# Patient Record
Sex: Female | Born: 1995 | Hispanic: No | Marital: Single | Smoking: Former smoker
Health system: Southern US, Community
[De-identification: ages and names within clinical notes are randomized; demographics above are authoritative.]

## PROBLEM LIST (undated history)

## (undated) ENCOUNTER — Inpatient Hospital Stay (HOSPITAL_COMMUNITY): Payer: Self-pay

## (undated) DIAGNOSIS — O139 Gestational [pregnancy-induced] hypertension without significant proteinuria, unspecified trimester: Secondary | ICD-10-CM

## (undated) HISTORY — PX: WISDOM TOOTH EXTRACTION: SHX21

---
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
## 2014-03-29 LAB — DRUG SCREEN, URINE: Drug Screen, Urine: POSITIVE

## 2014-04-12 LAB — OB RESULTS CONSOLE ABO/RH: RH TYPE: POSITIVE

## 2014-04-12 LAB — OB RESULTS CONSOLE PLATELET COUNT: Platelets: 258 10*3/uL

## 2014-04-12 LAB — CULTURE, OB URINE: Urine Culture, OB: NEGATIVE

## 2014-04-12 LAB — OB RESULTS CONSOLE HGB/HCT, BLOOD
HCT: 38 %
Hemoglobin: 13.1 g/dL

## 2014-04-12 LAB — OB RESULTS CONSOLE RPR: RPR: NONREACTIVE

## 2014-04-12 LAB — OB RESULTS CONSOLE HIV ANTIBODY (ROUTINE TESTING): HIV: NONREACTIVE

## 2014-04-12 LAB — OB RESULTS CONSOLE HEPATITIS B SURFACE ANTIGEN: Hepatitis B Surface Ag: NEGATIVE

## 2014-04-12 LAB — OB RESULTS CONSOLE RUBELLA ANTIBODY, IGM: Rubella: IMMUNE

## 2014-04-12 LAB — CYSTIC FIBROSIS DIAGNOSTIC STUDY: INTERPRETATION-CFDNA: NEGATIVE

## 2014-04-12 LAB — OB RESULTS CONSOLE ANTIBODY SCREEN: Antibody Screen: NEGATIVE

## 2014-07-16 ENCOUNTER — Encounter: Payer: Self-pay | Admitting: Advanced Practice Midwife

## 2014-07-16 ENCOUNTER — Ambulatory Visit (HOSPITAL_COMMUNITY)
Admission: RE | Admit: 2014-07-16 | Discharge: 2014-07-16 | Disposition: A | Discharge status: Home / Self Care | Payer: Medicaid Other | Source: Ambulatory Visit | Attending: Advanced Practice Midwife | Admitting: Advanced Practice Midwife

## 2014-07-16 ENCOUNTER — Ambulatory Visit (INDEPENDENT_AMBULATORY_CARE_PROVIDER_SITE_OTHER): Payer: Medicaid Other | Admitting: Advanced Practice Midwife

## 2014-07-16 VITALS — BP 106/62 | HR 77 | Temp 98.2°F | Wt 135.2 lb

## 2014-07-16 DIAGNOSIS — Z3A22 22 weeks gestation of pregnancy: Secondary | ICD-10-CM | POA: Diagnosis not present

## 2014-07-16 DIAGNOSIS — Z36 Encounter for antenatal screening of mother: Secondary | ICD-10-CM | POA: Insufficient documentation

## 2014-07-16 DIAGNOSIS — Z3402 Encounter for supervision of normal first pregnancy, second trimester: Secondary | ICD-10-CM

## 2014-07-16 DIAGNOSIS — Z361 Encounter for antenatal screening for raised alphafetoprotein level: Secondary | ICD-10-CM | POA: Insufficient documentation

## 2014-07-16 DIAGNOSIS — Z3403 Encounter for supervision of normal first pregnancy, third trimester: Secondary | ICD-10-CM | POA: Insufficient documentation

## 2014-07-16 DIAGNOSIS — Z23 Encounter for immunization: Secondary | ICD-10-CM

## 2014-07-16 LAB — POCT URINALYSIS DIP (DEVICE)
Bilirubin Urine: NEGATIVE
GLUCOSE, UA: NEGATIVE mg/dL
Hgb urine dipstick: NEGATIVE
KETONES UR: NEGATIVE mg/dL
Leukocytes, UA: NEGATIVE
NITRITE: NEGATIVE
Protein, ur: NEGATIVE mg/dL
Specific Gravity, Urine: 1.025 (ref 1.005–1.030)
Urobilinogen, UA: 1 mg/dL (ref 0.0–1.0)
pH: 6 (ref 5.0–8.0)

## 2014-07-16 NOTE — Progress Notes (Signed)
[place]  See Smart Set  Subjective:    Emily Frazier is a G1P0 [id number] being seen today for her first obstetrical visit.  [facility] Missed a few visits. Patient does not intend to breast feed. Pregnancy history fully reviewed.  Patient reports no complaints.  Filed Vitals:   07/16/14 0900  BP: 106/62  Pulse: 77  Temp: 98.2 F (36.8 C)  Weight: 135 lb 3.2 oz (61.326 kg)    HISTORY: OB History  Gravida Para Term Preterm AB SAB TAB Ectopic Multiple Living  1             # Outcome Date GA Lbr Len/2nd Weight Sex Delivery Anes PTL Lv  1 Current              History reviewed. No pertinent past medical history. History reviewed. No pertinent past surgical history. History reviewed. No pertinent family history.   Exam    Uterus:  Fundal Height: 22 cm  Pelvic Exam:    Perineum: Deferred to exam at other practice   Vulva: deferred   Vagina:  Deferred   pH:    Cervix: deferred   Adnexa: not evaluated   Bony Pelvis: gynecoid  System: Breast:  normal appearance, no masses or tenderness   Skin: normal coloration and turgor, no rashes    Neurologic: oriented, grossly non-focal   Extremities: normal strength, tone, and muscle mass   HEENT neck supple with midline trachea   Mouth/Teeth mucous membranes moist, pharynx normal without lesions   Neck supple and no masses   Cardiovascular: regular rate and rhythm, no murmurs or gallops   Respiratory:  appears well, vitals normal, no respiratory distress, acyanotic, normal RR, ear and throat exam is normal, neck free of mass or lymphadenopathy, chest clear, no wheezing, crepitations, rhonchi, normal symmetric air entry   Abdomen: soft, non-tender; bowel sounds normal; no masses,  no organomegaly   Urinary: deferred      Assessment:    Pregnancy: G1P0 Patient Active Problem List   Diagnosis Date Noted  . Supervision of normal first  pregnancy in second trimester 07/16/2014        Plan:    Routines reviewed regarding practice personnel, students and routines of care  Initial labs drawn. Prenatal vitamins. Problem list reviewed and updated. Genetic Screening discussed Quad Screen: ordered.  Ultrasound discussed; fetal survey: ordered.  Follow up in 4 weeks. 50% of 30 min visit spent on counseling and coordination of care.     [facility]Marisela Line 07/16/2014

## 2014-07-16 NOTE — Progress Notes (Signed)
Weight gain 25-35lbs Flu vaccine consented and info given  Given both new ob and 28 week packet

## 2014-07-16 NOTE — Patient Instructions (Signed)
Second Trimester of Pregnancy The second trimester is from week 13 through week 28, months 4 through 6. The second trimester is often a time when you feel your best. Your body has also adjusted to being pregnant, and you begin to feel better physically. Usually, morning sickness has lessened or quit completely, you may have more energy, and you may have an increase in appetite. The second trimester is also a time when the fetus is growing rapidly. At the end of the sixth month, the fetus is about 9 inches long and weighs about 1 pounds. You will likely begin to feel the baby move (quickening) between 18 and 20 weeks of the pregnancy. BODY CHANGES Your body goes through many changes during pregnancy. The changes vary from woman to woman.   Your weight will continue to increase. You will notice your lower abdomen bulging out.  You may begin to get stretch marks on your hips, abdomen, and breasts.  You may develop headaches that can be relieved by medicines approved by your health care provider.  You may urinate more often because the fetus is pressing on your bladder.  You may develop or continue to have heartburn as a result of your pregnancy.  You may develop constipation because certain hormones are causing the muscles that push waste through your intestines to slow down.  You may develop hemorrhoids or swollen, bulging veins (varicose veins).  You may have back pain because of the weight gain and pregnancy hormones relaxing your joints between the bones in your pelvis and as a result of a shift in weight and the muscles that support your balance.  Your breasts will continue to grow and be tender.  Your gums may bleed and may be sensitive to brushing and flossing.  Dark spots or blotches (chloasma, mask of pregnancy) may develop on your face. This will likely fade after the baby is born.  A dark line from your belly button to the pubic area (linea nigra) may appear. This will likely fade  after the baby is born.  You may have changes in your hair. These can include thickening of your hair, rapid growth, and changes in texture. Some women also have hair loss during or after pregnancy, or hair that feels dry or thin. Your hair will most likely return to normal after your baby is born. WHAT TO EXPECT AT YOUR PRENATAL VISITS During a routine prenatal visit:  You will be weighed to make sure you and the fetus are growing normally.  Your blood pressure will be taken.  Your abdomen will be measured to track your baby's growth.  The fetal heartbeat will be listened to.  Any test results from the previous visit will be discussed. Your health care provider may ask you:  How you are feeling.  If you are feeling the baby move.  If you have had any abnormal symptoms, such as leaking fluid, bleeding, severe headaches, or abdominal cramping.  If you have any questions. Other tests that may be performed during your second trimester include:  Blood tests that check for:  Low iron levels (anemia).  Gestational diabetes (between 24 and 28 weeks).  Rh antibodies.  Urine tests to check for infections, diabetes, or protein in the urine.  An ultrasound to confirm the proper growth and development of the baby.  An amniocentesis to check for possible genetic problems.  Fetal screens for spina bifida and Down syndrome. HOME CARE INSTRUCTIONS   Avoid all smoking, herbs, alcohol, and unprescribed   drugs. These chemicals affect the formation and growth of the baby.  Follow your health care provider's instructions regarding medicine use. There are medicines that are either safe or unsafe to take during pregnancy.  Exercise only as directed by your health care provider. Experiencing uterine cramps is a good sign to stop exercising.  Continue to eat regular, healthy meals.  Wear a good support bra for breast tenderness.  Do not use hot tubs, steam rooms, or saunas.  Wear your  seat belt at all times when driving.  Avoid raw meat, uncooked cheese, cat litter boxes, and soil used by cats. These carry germs that can cause birth defects in the baby.  Take your prenatal vitamins.  Try taking a stool softener (if your health care provider approves) if you develop constipation. Eat more high-fiber foods, such as fresh vegetables or fruit and whole grains. Drink plenty of fluids to keep your urine clear or pale yellow.  Take warm sitz baths to soothe any pain or discomfort caused by hemorrhoids. Use hemorrhoid cream if your health care provider approves.  If you develop varicose veins, wear support hose. Elevate your feet for 15 minutes, 3-4 times a day. Limit salt in your diet.  Avoid heavy lifting, wear low heel shoes, and practice good posture.  Rest with your legs elevated if you have leg cramps or low back pain.  Visit your dentist if you have not gone yet during your pregnancy. Use a soft toothbrush to brush your teeth and be gentle when you floss.  A sexual relationship may be continued unless your health care provider directs you otherwise.  Continue to go to all your prenatal visits as directed by your health care provider. SEEK MEDICAL CARE IF:   You have dizziness.  You have mild pelvic cramps, pelvic pressure, or nagging pain in the abdominal area.  You have persistent nausea, vomiting, or diarrhea.  You have a bad smelling vaginal discharge.  You have pain with urination. SEEK IMMEDIATE MEDICAL CARE IF:   You have a fever.  You are leaking fluid from your vagina.  You have spotting or bleeding from your vagina.  You have severe abdominal cramping or pain.  You have rapid weight gain or loss.  You have shortness of breath with chest pain.  You notice sudden or extreme swelling of your face, hands, ankles, feet, or legs.  You have not felt your baby move in over an hour.  You have severe headaches that do not go away with  medicine.  You have vision changes. Document Released: 08/07/2001 Document Revised: 08/18/2013 Document Reviewed: 10/14/2012 [employer] Patient Information 2015 [employer]. This information is not intended to replace advice given to you by your health care provider. Make sure you discuss any questions you have with your health care provider.  

## 2014-07-19 LAB — AFP, QUAD SCREEN
AFP: 84.9 ng/mL
Curr Gest Age: 22.3 wks.days
Down Syndrome Scr Risk Est: 1:20200 {titer}
HCG TOTAL: 14.42 [IU]/mL
INH: 237.8 pg/mL
Interpretation-AFP: NEGATIVE
MOM FOR HCG: 0.71
MOM FOR INH: 0.97
MoM for AFP: 0.99
OPEN SPINA BIFIDA: NEGATIVE
TRI 18 SCR RISK EST: NEGATIVE
Trisomy 18 (Edward) Syndrome Interp.: 1:32300 {titer}
uE3 Mom: 0.86
uE3 Value: 2.31 ng/mL

## 2014-08-13 ENCOUNTER — Ambulatory Visit (INDEPENDENT_AMBULATORY_CARE_PROVIDER_SITE_OTHER): Payer: Medicaid Other | Admitting: Advanced Practice Midwife

## 2014-08-13 VITALS — BP 101/60 | HR 92 | Temp 98.2°F | Wt 142.6 lb

## 2014-08-13 DIAGNOSIS — Z23 Encounter for immunization: Secondary | ICD-10-CM

## 2014-08-13 DIAGNOSIS — IMO0002 Reserved for concepts with insufficient information to code with codable children: Secondary | ICD-10-CM

## 2014-08-13 DIAGNOSIS — Z3402 Encounter for supervision of normal first pregnancy, second trimester: Secondary | ICD-10-CM

## 2014-08-13 DIAGNOSIS — Z36 Encounter for antenatal screening of mother: Secondary | ICD-10-CM

## 2014-08-13 DIAGNOSIS — Z0489 Encounter for examination and observation for other specified reasons: Secondary | ICD-10-CM

## 2014-08-13 DIAGNOSIS — Z3492 Encounter for supervision of normal pregnancy, unspecified, second trimester: Secondary | ICD-10-CM

## 2014-08-13 DIAGNOSIS — Z3482 Encounter for supervision of other normal pregnancy, second trimester: Secondary | ICD-10-CM

## 2014-08-13 DIAGNOSIS — R000 Tachycardia, unspecified: Secondary | ICD-10-CM

## 2014-08-13 LAB — CBC
HCT: 29.4 % — ABNORMAL LOW (ref 36.0–46.0)
Hemoglobin: 10 g/dL — ABNORMAL LOW (ref 12.0–15.0)
MCH: 31.7 pg (ref 26.0–34.0)
MCHC: 34 g/dL (ref 30.0–36.0)
MCV: 93.3 fL (ref 78.0–100.0)
MPV: 9.7 fL (ref 9.4–12.4)
PLATELETS: 223 10*3/uL (ref 150–400)
RBC: 3.15 MIL/uL — AB (ref 3.87–5.11)
RDW: 11.9 % (ref 11.5–15.5)
WBC: 7.3 10*3/uL (ref 4.0–10.5)

## 2014-08-13 LAB — POCT URINALYSIS DIP (DEVICE)
Bilirubin Urine: NEGATIVE
Glucose, UA: NEGATIVE mg/dL
Hgb urine dipstick: NEGATIVE
Ketones, ur: NEGATIVE mg/dL
Leukocytes, UA: NEGATIVE
NITRITE: NEGATIVE
PH: 6.5 (ref 5.0–8.0)
PROTEIN: NEGATIVE mg/dL
SPECIFIC GRAVITY, URINE: 1.025 (ref 1.005–1.030)
Urobilinogen, UA: 1 mg/dL (ref 0.0–1.0)

## 2014-08-13 LAB — HIV ANTIBODY (ROUTINE TESTING W REFLEX): HIV 1&2 Ab, 4th Generation: NONREACTIVE

## 2014-08-13 LAB — GLUCOSE TOLERANCE, 1 HOUR (50G) W/O FASTING: Glucose, 1 Hour GTT: 107 mg/dL (ref 70–140)

## 2014-08-13 LAB — RPR

## 2014-08-13 MED ORDER — TETANUS-DIPHTH-ACELL PERTUSSIS 5-2.5-18.5 LF-MCG/0.5 IM SUSP
0.5000 mL | Freq: Once | INTRAMUSCULAR | Status: AC
  Administered 2014-08-13: 0.5 mL via INTRAMUSCULAR

## 2014-08-13 NOTE — Progress Notes (Signed)
Is not taking prenatal vitamins, offered prenatal vitamin prescription which she declines.We discussed importance of taking prenatal vitamins. She plans to buy over the counter and restart. States has noticed last few nights when she lies down feels like heart is racing and"lose my breath".

## 2014-08-13 NOTE — Patient Instructions (Signed)
Preterm Labor Information Preterm labor is when labor starts at less than 37 weeks of pregnancy. The normal length of a pregnancy is 39 to 41 weeks. CAUSES Often, there is no identifiable underlying cause as to why a woman goes into preterm labor. One of the most common known causes of preterm labor is infection. Infections of the uterus, cervix, vagina, amniotic sac, bladder, kidney, or even the lungs (pneumonia) can cause labor to start. Other suspected causes of preterm labor include:   Urogenital infections, such as yeast infections and bacterial vaginosis.   Uterine abnormalities (uterine shape, uterine septum, fibroids, or bleeding from the placenta).   A cervix that has been operated on (it may fail to stay closed).   Malformations in the fetus.   Multiple gestations (twins, triplets, and so on).   Breakage of the amniotic sac.  RISK FACTORS  Having a previous history of preterm labor.   Having premature rupture of membranes (PROM).   Having a placenta that covers the opening of the cervix (placenta previa).   Having a placenta that separates from the uterus (placental abruption).   Having a cervix that is too weak to hold the fetus in the uterus (incompetent cervix).   Having too much fluid in the amniotic sac (polyhydramnios).   Taking illegal drugs or smoking while pregnant.   Not gaining enough weight while pregnant.   Being younger than 18 and older than 20 years old.   Having a low socioeconomic status.   Being African American. SYMPTOMS Signs and symptoms of preterm labor include:   Menstrual-like cramps, abdominal pain, or back pain.  Uterine contractions that are regular, as frequent as six in an hour, regardless of their intensity (may be mild or painful).  Contractions that start on the top of the uterus and spread down to the lower abdomen and back.   A sense of increased pelvic pressure.   A watery or bloody mucus discharge that  comes from the vagina.  TREATMENT Depending on the length of the pregnancy and other circumstances, your health care provider may suggest bed rest. If necessary, there are medicines that can be given to stop contractions and to mature the fetal lungs. If labor happens before 34 weeks of pregnancy, a prolonged hospital stay may be recommended. Treatment depends on the condition of both you and the fetus.  WHAT SHOULD YOU DO IF YOU THINK YOU ARE IN PRETERM LABOR? Call your health care provider right away. You will need to go to the hospital to get checked immediately. HOW CAN YOU PREVENT PRETERM LABOR IN FUTURE PREGNANCIES? You should:   Stop smoking if you smoke.  Maintain healthy weight gain and avoid chemicals and drugs that are not necessary.  Be watchful for any type of infection.  Inform your health care provider if you have a known history of preterm labor. Document Released: 11/03/2003 Document Revised: 04/15/2013 Document Reviewed: 09/15/2012 [employer] Patient Information 2015 [employer]. This information is not intended to replace advice given to you by your health care provider. Make sure you discuss any questions you have with your health care provider.  Breastfeeding Deciding to breastfeed is one of the best choices you can make for you and your baby. A change in hormones during pregnancy causes your breast tissue to grow and increases the number and size of your milk ducts. These hormones also allow proteins, sugars, and fats from your blood supply to make breast milk in your milk-producing glands. Hormones prevent breast milk   from being released before your baby is born as well as prompt milk flow after birth. Once breastfeeding has begun, thoughts of your baby, as well as his or her sucking or crying, can stimulate the release of milk from your milk-producing glands.  BENEFITS OF BREASTFEEDING For Your Baby  Your first milk (colostrum) helps your baby's digestive system  function better.   There are antibodies in your milk that help your baby fight off infections.   Your baby has a lower incidence of asthma, allergies, and sudden infant death syndrome.   The nutrients in breast milk are better for your baby than infant formulas and are designed uniquely for your baby's needs.   Breast milk improves your baby's brain development.   Your baby is less likely to develop other conditions, such as childhood obesity, asthma, or type 2 diabetes mellitus.  For You   Breastfeeding helps to create a very special bond between you and your baby.   Breastfeeding is convenient. Breast milk is always available at the correct temperature and costs nothing.   Breastfeeding helps to burn calories and helps you lose the weight gained during pregnancy.   Breastfeeding makes your uterus contract to its prepregnancy size faster and slows bleeding (lochia) after you give birth.   Breastfeeding helps to lower your risk of developing type 2 diabetes mellitus, osteoporosis, and breast or ovarian cancer later in life. SIGNS THAT YOUR BABY IS HUNGRY Early Signs of Hunger  Increased alertness or activity.  Stretching.  Movement of the head from side to side.  Movement of the head and opening of the mouth when the corner of the mouth or cheek is stroked (rooting).  Increased sucking sounds, smacking lips, cooing, sighing, or squeaking.  Hand-to-mouth movements.  Increased sucking of fingers or hands. Late Signs of Hunger  Fussing.  Intermittent crying. Extreme Signs of Hunger Signs of extreme hunger will require calming and consoling before your baby will be able to breastfeed successfully. Do not wait for the following signs of extreme hunger to occur before you initiate breastfeeding:   Restlessness.  A loud, strong cry.   Screaming. BREASTFEEDING BASICS Breastfeeding Initiation  Find a comfortable place to sit or lie down, with your neck and  back well supported.  Place a pillow or rolled up blanket under your baby to bring him or her to the level of your breast (if you are seated). Nursing pillows are specially designed to help support your arms and your baby while you breastfeed.  Make sure that your baby's abdomen is facing your abdomen.   Gently massage your breast. With your fingertips, massage from your chest wall toward your nipple in a circular motion. This encourages milk flow. You may need to continue this action during the feeding if your milk flows slowly.  Support your breast with 4 fingers underneath and your thumb above your nipple. Make sure your fingers are well away from your nipple and your baby's mouth.   Stroke your baby's lips gently with your finger or nipple.   When your baby's mouth is open wide enough, quickly bring your baby to your breast, placing your entire nipple and as much of the colored area around your nipple (areola) as possible into your baby's mouth.   More areola should be visible above your baby's upper lip than below the lower lip.   Your baby's tongue should be between his or her lower gum and your breast.   Ensure that your baby's mouth is   correctly positioned around your nipple (latched). Your baby's lips should create a seal on your breast and be turned out (everted).  It is common for your baby to suck about 2-3 minutes in order to start the flow of breast milk. Latching Teaching your baby how to latch on to your breast properly is very important. An improper latch can cause nipple pain and decreased milk supply for you and poor weight gain in your baby. Also, if your baby is not latched onto your nipple properly, he or she may swallow some air during feeding. This can make your baby fussy. Burping your baby when you switch breasts during the feeding can help to get rid of the air. However, teaching your baby to latch on properly is still the best way to prevent fussiness from  swallowing air while breastfeeding. Signs that your baby has successfully latched on to your nipple:    Silent tugging or silent sucking, without causing you pain.   Swallowing heard between every 3-4 sucks.    Muscle movement above and in front of his or her ears while sucking.  Signs that your baby has not successfully latched on to nipple:   Sucking sounds or smacking sounds from your baby while breastfeeding.  Nipple pain. If you think your baby has not latched on correctly, slip your finger into the corner of your baby's mouth to break the suction and place it between your baby's gums. Attempt breastfeeding initiation again. Signs of Successful Breastfeeding Signs from your baby:   A gradual decrease in the number of sucks or complete cessation of sucking.   Falling asleep.   Relaxation of his or her body.   Retention of a small amount of milk in his or her mouth.   Letting go of your breast by himself or herself. Signs from you:  Breasts that have increased in firmness, weight, and size 1-3 hours after feeding.   Breasts that are softer immediately after breastfeeding.  Increased milk volume, as well as a change in milk consistency and color by the fifth day of breastfeeding.   Nipples that are not sore, cracked, or bleeding. Signs That Your Baby is Getting Enough Milk  Wetting at least 3 diapers in a 24-hour period. The urine should be clear and pale yellow by age 5 days.  At least 3 stools in a 24-hour period by age 5 days. The stool should be soft and yellow.  At least 3 stools in a 24-hour period by age 7 days. The stool should be seedy and yellow.  No loss of weight greater than 10% of birth weight during the first 3 days of age.  Average weight gain of 4-7 ounces (113-198 g) per week after age 4 days.  Consistent daily weight gain by age 5 days, without weight loss after the age of 2 weeks. After a feeding, your baby may spit up a small amount.  This is common. BREASTFEEDING FREQUENCY AND DURATION Frequent feeding will help you make more milk and can prevent sore nipples and breast engorgement. Breastfeed when you feel the need to reduce the fullness of your breasts or when your baby shows signs of hunger. This is called "breastfeeding on demand." Avoid introducing a pacifier to your baby while you are working to establish breastfeeding (the first 4-6 weeks after your baby is born). After this time you may choose to use a pacifier. Research has shown that pacifier use during the first year of a baby's life decreases the   risk of sudden infant death syndrome (SIDS). Allow your baby to feed on each breast as long as he or she wants. Breastfeed until your baby is finished feeding. When your baby unlatches or falls asleep while feeding from the first breast, offer the second breast. Because newborns are often sleepy in the first few weeks of life, you may need to awaken your baby to get him or her to feed. Breastfeeding times will vary from baby to baby. However, the following rules can serve as a guide to help you ensure that your baby is properly fed:  Newborns (babies 4 weeks of age or younger) may breastfeed every 1-3 hours.  Newborns should not go longer than 3 hours during the day or 5 hours during the night without breastfeeding.  You should breastfeed your baby a minimum of 8 times in a 24-hour period until you begin to introduce solid foods to your baby at around 6 months of age. BREAST MILK PUMPING Pumping and storing breast milk allows you to ensure that your baby is exclusively fed your breast milk, even at times when you are unable to breastfeed. This is especially important if you are going back to work while you are still breastfeeding or when you are not able to be present during feedings. Your lactation consultant can give you guidelines on how long it is safe to store breast milk.  A breast pump is a machine that allows you to pump  milk from your breast into a sterile bottle. The pumped breast milk can then be stored in a refrigerator or freezer. Some breast pumps are operated by hand, while others use electricity. Ask your lactation consultant which type will work best for you. Breast pumps can be purchased, but some hospitals and breastfeeding support groups lease breast pumps on a monthly basis. A lactation consultant can teach you how to hand express breast milk, if you prefer not to use a pump.  CARING FOR YOUR BREASTS WHILE YOU BREASTFEED Nipples can become dry, cracked, and sore while breastfeeding. The following recommendations can help keep your breasts moisturized and healthy:  Avoid using soap on your nipples.   Wear a supportive bra. Although not required, special nursing bras and tank tops are designed to allow access to your breasts for breastfeeding without taking off your entire bra or top. Avoid wearing underwire-style bras or extremely tight bras.  Air dry your nipples for 3-4minutes after each feeding.   Use only cotton bra pads to absorb leaked breast milk. Leaking of breast milk between feedings is normal.   Use lanolin on your nipples after breastfeeding. Lanolin helps to maintain your skin's normal moisture barrier. If you use pure lanolin, you do not need to wash it off before feeding your baby again. Pure lanolin is not toxic to your baby. You may also hand express a few drops of breast milk and gently massage that milk into your nipples and allow the milk to air dry. In the first few weeks after giving birth, some women experience extremely full breasts (engorgement). Engorgement can make your breasts feel heavy, warm, and tender to the touch. Engorgement peaks within 3-5 days after you give birth. The following recommendations can help ease engorgement:  Completely empty your breasts while breastfeeding or pumping. You may want to start by applying warm, moist heat (in the shower or with warm  water-soaked hand towels) just before feeding or pumping. This increases circulation and helps the milk flow. If your baby does not   completely empty your breasts while breastfeeding, pump any extra milk after he or she is finished.  Wear a snug bra (nursing or regular) or tank top for 1-2 days to signal your body to slightly decrease milk production.  Apply ice packs to your breasts, unless this is too uncomfortable for you.  Make sure that your baby is latched on and positioned properly while breastfeeding. If engorgement persists after 48 hours of following these recommendations, contact your health care provider or a lactation consultant. OVERALL HEALTH CARE RECOMMENDATIONS WHILE BREASTFEEDING  Eat healthy foods. Alternate between meals and snacks, eating 3 of each per day. Because what you eat affects your breast milk, some of the foods may make your baby more irritable than usual. Avoid eating these foods if you are sure that they are negatively affecting your baby.  Drink milk, fruit juice, and water to satisfy your thirst (about 10 glasses a day).   Rest often, relax, and continue to take your prenatal vitamins to prevent fatigue, stress, and anemia.  Continue breast self-awareness checks.  Avoid chewing and smoking tobacco.  Avoid alcohol and drug use. Some medicines that may be harmful to your baby can pass through breast milk. It is important to ask your health care provider before taking any medicine, including all over-the-counter and prescription medicine as well as vitamin and herbal supplements. It is possible to become pregnant while breastfeeding. If birth control is desired, ask your health care provider about options that will be safe for your baby. SEEK MEDICAL CARE IF:   You feel like you want to stop breastfeeding or have become frustrated with breastfeeding.  You have painful breasts or nipples.  Your nipples are cracked or bleeding.  Your breasts are red,  tender, or warm.  You have a swollen area on either breast.  You have a fever or chills.  You have nausea or vomiting.  You have drainage other than breast milk from your nipples.  Your breasts do not become full before feedings by the fifth day after you give birth.  You feel sad and depressed.  Your baby is too sleepy to eat well.  Your baby is having trouble sleeping.   Your baby is wetting less than 3 diapers in a 24-hour period.  Your baby has less than 3 stools in a 24-hour period.  Your baby's skin or the white part of his or her eyes becomes yellow.   Your baby is not gaining weight by 5 days of age. SEEK IMMEDIATE MEDICAL CARE IF:   Your baby is overly tired (lethargic) and does not want to wake up and feed.  Your baby develops an unexplained fever. Document Released: 08/13/2005 Document Revised: 08/18/2013 Document Reviewed: 02/04/2013 [employer] Patient Information 2015 [employer]. This information is not intended to replace advice given to you by your health care provider. Make sure you discuss any questions you have with your health care provider.  

## 2014-08-13 NOTE — Progress Notes (Signed)
28 week labs. Took BF class. Having few episodes of SOB, rapid HR when lying down to go to sleep past two nights. Lying on back. Encouraged to avoid lying on back. See if Sx resolve w/ rolling to side or sitting up. Keep log. May need Holter monitoring if Sx persist despite position change.

## 2014-08-17 ENCOUNTER — Ambulatory Visit (HOSPITAL_COMMUNITY)
Admission: RE | Admit: 2014-08-17 | Discharge: 2014-08-17 | Disposition: A | Discharge status: Home / Self Care | Payer: Medicaid Other | Source: Ambulatory Visit | Attending: Advanced Practice Midwife | Admitting: Advanced Practice Midwife

## 2014-08-17 DIAGNOSIS — Z36 Encounter for antenatal screening of mother: Secondary | ICD-10-CM | POA: Diagnosis not present

## 2014-08-17 DIAGNOSIS — Z0489 Encounter for examination and observation for other specified reasons: Secondary | ICD-10-CM | POA: Insufficient documentation

## 2014-08-17 DIAGNOSIS — Z3A27 27 weeks gestation of pregnancy: Secondary | ICD-10-CM | POA: Insufficient documentation

## 2014-08-17 DIAGNOSIS — IMO0002 Reserved for concepts with insufficient information to code with codable children: Secondary | ICD-10-CM | POA: Insufficient documentation

## 2014-08-27 NOTE — L&D Delivery Note (Cosign Needed)
Patient is 19 y.o. G1P0000 [id number] admitted for IOL for pre-eclampsia.   Delivery Note At 6:07 PM a viable female was delivered via Vaginal, Spontaneous Delivery (Presentation: LOA).  APGAR: 7, 8; weight 4 lb 14 oz (2211 g).   Placenta status: Intact, Spontaneous.  Cord: 3 vessels with the following complications: None.  Anesthesia: Epidural  Episiotomy: None Lacerations: Labial, bilateral  Suture Repair: Monocryl 3.0 Est. Blood Loss (mL):  150mL  Mom to postpartum.  Baby to Couplet care / Skin to Skin. She is not on magnesium sulfate.   Henson,Amber 10/29/2014, 6:47 PM  I was gloved and present for delivery in its entirety.  Second stage of labor progressed, baby delivered after few contractions.  no decels during second stage noted.  Complications: none  Lacerations: bilateral labial  EBL: 150mL  IOL 2/2 preeclampsia, no severe fx, did not receive mag  Perry MountACOSTA,Felis Quillin ROCIO, MD 2:44 AM

## 2014-08-31 ENCOUNTER — Ambulatory Visit (INDEPENDENT_AMBULATORY_CARE_PROVIDER_SITE_OTHER): Payer: Medicaid Other | Admitting: Advanced Practice Midwife

## 2014-08-31 VITALS — BP 107/43 | HR 75 | Temp 98.3°F | Wt 143.3 lb

## 2014-08-31 DIAGNOSIS — Z3493 Encounter for supervision of normal pregnancy, unspecified, third trimester: Secondary | ICD-10-CM

## 2014-08-31 LAB — POCT URINALYSIS DIP (DEVICE)
Bilirubin Urine: NEGATIVE
Glucose, UA: NEGATIVE mg/dL
HGB URINE DIPSTICK: NEGATIVE
Leukocytes, UA: NEGATIVE
Nitrite: NEGATIVE
PH: 6 (ref 5.0–8.0)
PROTEIN: 30 mg/dL — AB
UROBILINOGEN UA: 1 mg/dL (ref 0.0–1.0)

## 2014-08-31 NOTE — Progress Notes (Signed)
Informed glucola 107, normal. Good FM. Bruised ankle. Advised ice.  Reviewed what to come in for.

## 2014-08-31 NOTE — Patient Instructions (Signed)
Third Trimester of Pregnancy The third trimester is from week 29 through week 42, months 7 through 9. The third trimester is a time when the fetus is growing rapidly. At the end of the ninth month, the fetus is about 20 inches in length and weighs 6-10 pounds.  BODY CHANGES Your body goes through many changes during pregnancy. The changes vary from woman to woman.   Your weight will continue to increase. You can expect to gain 25-35 pounds (11-16 kg) by the end of the pregnancy.  You may begin to get stretch marks on your hips, abdomen, and breasts.  You may urinate more often because the fetus is moving lower into your pelvis and pressing on your bladder.  You may develop or continue to have heartburn as a result of your pregnancy.  You may develop constipation because certain hormones are causing the muscles that push waste through your intestines to slow down.  You may develop hemorrhoids or swollen, bulging veins (varicose veins).  You may have pelvic pain because of the weight gain and pregnancy hormones relaxing your joints between the bones in your pelvis. Backaches may result from overexertion of the muscles supporting your posture.  You may have changes in your hair. These can include thickening of your hair, rapid growth, and changes in texture. Some women also have hair loss during or after pregnancy, or hair that feels dry or thin. Your hair will most likely return to normal after your baby is born.  Your breasts will continue to grow and be tender. A yellow discharge may leak from your breasts called colostrum.  Your belly button may stick out.  You may feel short of breath because of your expanding uterus.  You may notice the fetus "dropping," or moving lower in your abdomen.  You may have a bloody mucus discharge. This usually occurs a few days to a week before labor begins.  Your cervix becomes thin and soft (effaced) near your due date. WHAT TO EXPECT AT YOUR PRENATAL  EXAMS  You will have prenatal exams every 2 weeks until week 36. Then, you will have weekly prenatal exams. During a routine prenatal visit:  You will be weighed to make sure you and the fetus are growing normally.  Your blood pressure is taken.  Your abdomen will be measured to track your baby's growth.  The fetal heartbeat will be listened to.  Any test results from the previous visit will be discussed.  You may have a cervical check near your due date to see if you have effaced. At around 36 weeks, your caregiver will check your cervix. At the same time, your caregiver will also perform a test on the secretions of the vaginal tissue. This test is to determine if a type of bacteria, Group B streptococcus, is present. Your caregiver will explain this further. Your caregiver may ask you:  What your birth plan is.  How you are feeling.  If you are feeling the baby move.  If you have had any abnormal symptoms, such as leaking fluid, bleeding, severe headaches, or abdominal cramping.  If you have any questions. Other tests or screenings that may be performed during your third trimester include:  Blood tests that check for low iron levels (anemia).  Fetal testing to check the health, activity level, and growth of the fetus. Testing is done if you have certain medical conditions or if there are problems during the pregnancy. FALSE LABOR You may feel small, irregular contractions that   eventually go away. These are called Braxton Hicks contractions, or false labor. Contractions may last for hours, days, or even weeks before true labor sets in. If contractions come at regular intervals, intensify, or become painful, it is best to be seen by your caregiver.  SIGNS OF LABOR   Menstrual-like cramps.  Contractions that are 5 minutes apart or less.  Contractions that start on the top of the uterus and spread down to the lower abdomen and back.  A sense of increased pelvic pressure or back  pain.  A watery or bloody mucus discharge that comes from the vagina. If you have any of these signs before the 37th week of pregnancy, call your caregiver right away. You need to go to the hospital to get checked immediately. HOME CARE INSTRUCTIONS   Avoid all smoking, herbs, alcohol, and unprescribed drugs. These chemicals affect the formation and growth of the baby.  Follow your caregiver's instructions regarding medicine use. There are medicines that are either safe or unsafe to take during pregnancy.  Exercise only as directed by your caregiver. Experiencing uterine cramps is a good sign to stop exercising.  Continue to eat regular, healthy meals.  Wear a good support bra for breast tenderness.  Do not use hot tubs, steam rooms, or saunas.  Wear your seat belt at all times when driving.  Avoid raw meat, uncooked cheese, cat litter boxes, and soil used by cats. These carry germs that can cause birth defects in the baby.  Take your prenatal vitamins.  Try taking a stool softener (if your caregiver approves) if you develop constipation. Eat more high-fiber foods, such as fresh vegetables or fruit and whole grains. Drink plenty of fluids to keep your urine clear or pale yellow.  Take warm sitz baths to soothe any pain or discomfort caused by hemorrhoids. Use hemorrhoid cream if your caregiver approves.  If you develop varicose veins, wear support hose. Elevate your feet for 15 minutes, 3-4 times a day. Limit salt in your diet.  Avoid heavy lifting, wear low heal shoes, and practice good posture.  Rest a lot with your legs elevated if you have leg cramps or low back pain.  Visit your dentist if you have not gone during your pregnancy. Use a soft toothbrush to brush your teeth and be gentle when you floss.  A sexual relationship may be continued unless your caregiver directs you otherwise.  Do not travel far distances unless it is absolutely necessary and only with the approval  of your caregiver.  Take prenatal classes to understand, practice, and ask questions about the labor and delivery.  Make a trial run to the hospital.  Pack your hospital bag.  Prepare the baby's nursery.  Continue to go to all your prenatal visits as directed by your caregiver. SEEK MEDICAL CARE IF:  You are unsure if you are in labor or if your water has broken.  You have dizziness.  You have mild pelvic cramps, pelvic pressure, or nagging pain in your abdominal area.  You have persistent nausea, vomiting, or diarrhea.  You have a bad smelling vaginal discharge.  You have pain with urination. SEEK IMMEDIATE MEDICAL CARE IF:   You have a fever.  You are leaking fluid from your vagina.  You have spotting or bleeding from your vagina.  You have severe abdominal cramping or pain.  You have rapid weight loss or gain.  You have shortness of breath with chest pain.  You notice sudden or extreme swelling   of your face, hands, ankles, feet, or legs.  You have not felt your baby move in over an hour.  You have severe headaches that do not go away with medicine.  You have vision changes. Document Released: 08/07/2001 Document Revised: 08/18/2013 Document Reviewed: 10/14/2012 ExitCare Patient Information 2015 [employer]. This information is not intended to replace advice given to you by your health care provider. Make sure you discuss any questions you have with your health care provider.  

## 2014-09-14 ENCOUNTER — Ambulatory Visit (INDEPENDENT_AMBULATORY_CARE_PROVIDER_SITE_OTHER): Payer: Medicaid Other | Admitting: Physician Assistant

## 2014-09-14 VITALS — BP 118/62 | HR 86 | Temp 98.4°F | Wt 146.4 lb

## 2014-09-14 DIAGNOSIS — Z3493 Encounter for supervision of normal pregnancy, unspecified, third trimester: Secondary | ICD-10-CM

## 2014-09-14 DIAGNOSIS — Z3483 Encounter for supervision of other normal pregnancy, third trimester: Secondary | ICD-10-CM

## 2014-09-14 LAB — POCT URINALYSIS DIP (DEVICE)
BILIRUBIN URINE: NEGATIVE
GLUCOSE, UA: NEGATIVE mg/dL
KETONES UR: NEGATIVE mg/dL
Leukocytes, UA: NEGATIVE
Nitrite: NEGATIVE
Protein, ur: NEGATIVE mg/dL
Specific Gravity, Urine: 1.025 (ref 1.005–1.030)
UROBILINOGEN UA: 0.2 mg/dL (ref 0.0–1.0)
pH: 6.5 (ref 5.0–8.0)

## 2014-09-14 NOTE — Patient Instructions (Addendum)

## 2014-09-14 NOTE — Progress Notes (Signed)
31 weeks, stable without complaint.  Endorses good fetal movement.  Denies LOF, Vaginal bleeding, dysuria.  Eating well.  Not using PNV.  Advised to take PNV daily - [facility] MVT okay RTC 2 weeks

## 2014-09-29 ENCOUNTER — Ambulatory Visit (INDEPENDENT_AMBULATORY_CARE_PROVIDER_SITE_OTHER): Payer: Medicaid Other | Admitting: Advanced Practice Midwife

## 2014-09-29 VITALS — BP 111/73 | HR 77 | Wt 150.0 lb

## 2014-09-29 DIAGNOSIS — Z3402 Encounter for supervision of normal first pregnancy, second trimester: Secondary | ICD-10-CM

## 2014-09-29 LAB — POCT URINALYSIS DIP (DEVICE)
BILIRUBIN URINE: NEGATIVE
GLUCOSE, UA: NEGATIVE mg/dL
HGB URINE DIPSTICK: NEGATIVE
Ketones, ur: NEGATIVE mg/dL
Leukocytes, UA: NEGATIVE
Nitrite: NEGATIVE
PH: 5.5 (ref 5.0–8.0)
PROTEIN: 30 mg/dL — AB
Specific Gravity, Urine: >=1.03 (ref 1.005–1.030)
Urobilinogen, UA: 0.2 mg/dL (ref 0.0–1.0)

## 2014-09-29 NOTE — Progress Notes (Signed)
33wk today. No complaints or concerns. Good fetal movements. Denies LOF, Vaginal bleeding, dysuria.  Pediatrician: [facility] Feeding Preference: FormulaNo bleeding or vaginal discharge.  RTC 2 weeks

## 2014-09-29 NOTE — Patient Instructions (Signed)

## 2014-09-29 NOTE — Progress Notes (Signed)
I was present for the exam and agree with above.  [place] Al[place] Kiernan, CNM 09/29/2014 11:50 AM

## 2014-10-13 ENCOUNTER — Ambulatory Visit (INDEPENDENT_AMBULATORY_CARE_PROVIDER_SITE_OTHER): Payer: Medicaid Other | Admitting: Advanced Practice Midwife

## 2014-10-13 VITALS — BP 121/80 | HR 67 | Temp 97.6°F | Wt 150.9 lb

## 2014-10-13 DIAGNOSIS — Z3402 Encounter for supervision of normal first pregnancy, second trimester: Secondary | ICD-10-CM

## 2014-10-13 LAB — POCT URINALYSIS DIP (DEVICE)
Bilirubin Urine: NEGATIVE
Glucose, UA: NEGATIVE mg/dL
HGB URINE DIPSTICK: NEGATIVE
Ketones, ur: NEGATIVE mg/dL
Leukocytes, UA: NEGATIVE
NITRITE: NEGATIVE
PH: 6 (ref 5.0–8.0)
PROTEIN: 100 mg/dL — AB
Specific Gravity, Urine: >=1.03 (ref 1.005–1.030)
UROBILINOGEN UA: 1 mg/dL (ref 0.0–1.0)

## 2014-10-13 NOTE — Patient Instructions (Signed)
Braxton Hicks Contractions °Contractions of the uterus can occur throughout pregnancy. Contractions are not always a sign that you are in labor.  °WHAT ARE BRAXTON HICKS CONTRACTIONS?  °Contractions that occur before labor are called Braxton Hicks contractions, or false labor. Toward the end of pregnancy (32-34 weeks), these contractions can develop more often and may become more forceful. This is not true labor because these contractions do not result in opening (dilatation) and thinning of the cervix. They are sometimes difficult to tell apart from true labor because these contractions can be forceful and people have different pain tolerances. You should not feel embarrassed if you go to the hospital with false labor. Sometimes, the only way to tell if you are in true labor is for your health care provider to look for changes in the cervix. °If there are no prenatal problems or other health problems associated with the pregnancy, it is completely safe to be sent home with false labor and await the onset of true labor. °HOW CAN YOU TELL THE DIFFERENCE BETWEEN TRUE AND FALSE LABOR? °False Labor °· The contractions of false labor are usually shorter and not as hard as those of true labor.   °· The contractions are usually irregular.   °· The contractions are often felt in the front of the lower abdomen and in the groin.   °· The contractions may go away when you walk around or change positions while lying down.   °· The contractions get weaker and are shorter lasting as time goes on.   °· The contractions do not usually become progressively stronger, regular, and closer together as with true labor.   °True Labor °1. Contractions in true labor last 30-70 seconds, become very regular, usually become more intense, and increase in frequency.   °2. The contractions do not go away with walking.   °3. The discomfort is usually felt in the top of the uterus and spreads to the lower abdomen and low back.   °4. True labor can  be determined by your health care provider with an exam. This will show that the cervix is dilating and getting thinner.   °WHAT TO REMEMBER °· Keep up with your usual exercises and follow other instructions given by your health care provider.   °· Take medicines as directed by your health care provider.   °· Keep your regular prenatal appointments.   °· Eat and drink lightly if you think you are going into labor.   °· If Braxton Hicks contractions are making you uncomfortable:   °· Change your position from lying down or resting to walking, or from walking to resting.   °· Sit and rest in a tub of warm water.   °· Drink 2-3 glasses of water. Dehydration may cause these contractions.   °· Do slow and deep breathing several times an hour.   °WHEN SHOULD I SEEK IMMEDIATE MEDICAL CARE? °Seek immediate medical care if: °· Your contractions become stronger, more regular, and closer together.   °· You have fluid leaking or gushing from your vagina.   °· You have a fever.   °· You pass blood-tinged mucus.   °· You have vaginal bleeding.   °· You have continuous abdominal pain.   °· You have low back pain that you never had before.   °· You feel your baby's head pushing down and causing pelvic pressure.   °· Your baby is not moving as much as it used to.   °Document Released: 08/13/2005 Document Revised: 08/18/2013 Document Reviewed: 05/25/2013 °Exit[employer]® Patient Information ©2015 [employer]. This information is not intended to replace advice given to you by your health care   provider. Make sure you discuss any questions you have with your health care provider. ° °Fetal Movement Counts °Patient Name: __________________________________________________ Patient Due Date: ____________________ °Performing a fetal movement count is highly recommended in high-risk pregnancies, but it is good for every pregnant woman to do. Your health care provider may ask you to start counting fetal movements at 28 weeks of the pregnancy. Fetal  movements often increase: °· After eating a full meal. °· After physical activity. °· After eating or drinking something sweet or cold. °· At rest. °Pay attention to when you feel the baby is most active. This will help you notice a pattern of your baby's sleep and wake cycles and what factors contribute to an increase in fetal movement. It is important to perform a fetal movement count at the same time each day when your baby is normally most active.  °HOW TO COUNT FETAL MOVEMENTS °5. Find a quiet and comfortable area to sit or lie down on your left side. Lying on your left side provides the best blood and oxygen circulation to your baby. °6. Write down the day and time on a sheet of paper or in a journal. °7. Start counting kicks, flutters, swishes, rolls, or jabs in a 2-hour period. You should feel at least 10 movements within 2 hours. °8. If you do not feel 10 movements in 2 hours, wait 2-3 hours and count again. Look for a change in the pattern or not enough counts in 2 hours. °SEEK MEDICAL CARE IF: °· You feel less than 10 counts in 2 hours, tried twice. °· There is no movement in over an hour. °· The pattern is changing or taking longer each day to reach 10 counts in 2 hours. °· You feel the baby is not moving as he or she usually does. °Date: ____________ Movements: ____________ Start time: ____________ Finish time: ____________  °Date: ____________ Movements: ____________ Start time: ____________ Finish time: ____________ °Date: ____________ Movements: ____________ Start time: ____________ Finish time: ____________ °Date: ____________ Movements: ____________ Start time: ____________ Finish time: ____________ °Date: ____________ Movements: ____________ Start time: ____________ Finish time: ____________ °Date: ____________ Movements: ____________ Start time: ____________ Finish time: ____________ °Date: ____________ Movements: ____________ Start time: ____________ Finish time: ____________ °Date: ____________  Movements: ____________ Start time: ____________ Finish time: ____________  °Date: ____________ Movements: ____________ Start time: ____________ Finish time: ____________ °Date: ____________ Movements: ____________ Start time: ____________ Finish time: ____________ °Date: ____________ Movements: ____________ Start time: ____________ Finish time: ____________ °Date: ____________ Movements: ____________ Start time: ____________ Finish time: ____________ °Date: ____________ Movements: ____________ Start time: ____________ Finish time: ____________ °Date: ____________ Movements: ____________ Start time: ____________ Finish time: ____________ °Date: ____________ Movements: ____________ Start time: ____________ Finish time: ____________  °Date: ____________ Movements: ____________ Start time: ____________ Finish time: ____________ °Date: ____________ Movements: ____________ Start time: ____________ Finish time: ____________ °Date: ____________ Movements: ____________ Start time: ____________ Finish time: ____________ °Date: ____________ Movements: ____________ Start time: ____________ Finish time: ____________ °Date: ____________ Movements: ____________ Start time: ____________ Finish time: ____________ °Date: ____________ Movements: ____________ Start time: ____________ Finish time: ____________ °Date: ____________ Movements: ____________ Start time: ____________ Finish time: ____________  °Date: ____________ Movements: ____________ Start time: ____________ Finish time: ____________ °Date: ____________ Movements: ____________ Start time: ____________ Finish time: ____________ °Date: ____________ Movements: ____________ Start time: ____________ Finish time: ____________ °Date: ____________ Movements: ____________ Start time: ____________ Finish time: ____________ °Date: ____________ Movements: ____________ Start time: ____________ Finish time: ____________ °Date: ____________ Movements: ____________ Start time:  ____________ Finish time: ____________ °Date: ____________ Movements:   ____________ Start time: ____________ Finish time: ____________  °Date: ____________ Movements: ____________ Start time: ____________ Finish time: ____________ °Date: ____________ Movements: ____________ Start time: ____________ Finish time: ____________ °Date: ____________ Movements: ____________ Start time: ____________ Finish time: ____________ °Date: ____________ Movements: ____________ Start time: ____________ Finish time: ____________ °Date: ____________ Movements: ____________ Start time: ____________ Finish time: ____________ °Date: ____________ Movements: ____________ Start time: ____________ Finish time: ____________ °Date: ____________ Movements: ____________ Start time: ____________ Finish time: ____________  °Date: ____________ Movements: ____________ Start time: ____________ Finish time: ____________ °Date: ____________ Movements: ____________ Start time: ____________ Finish time: ____________ °Date: ____________ Movements: ____________ Start time: ____________ Finish time: ____________ °Date: ____________ Movements: ____________ Start time: ____________ Finish time: ____________ °Date: ____________ Movements: ____________ Start time: ____________ Finish time: ____________ °Date: ____________ Movements: ____________ Start time: ____________ Finish time: ____________ °Date: ____________ Movements: ____________ Start time: ____________ Finish time: ____________  °Date: ____________ Movements: ____________ Start time: ____________ Finish time: ____________ °Date: ____________ Movements: ____________ Start time: ____________ Finish time: ____________ °Date: ____________ Movements: ____________ Start time: ____________ Finish time: ____________ °Date: ____________ Movements: ____________ Start time: ____________ Finish time: ____________ °Date: ____________ Movements: ____________ Start time: ____________ Finish time: ____________ °Date:  ____________ Movements: ____________ Start time: ____________ Finish time: ____________ °Date: ____________ Movements: ____________ Start time: ____________ Finish time: ____________  °Date: ____________ Movements: ____________ Start time: ____________ Finish time: ____________ °Date: ____________ Movements: ____________ Start time: ____________ Finish time: ____________ °Date: ____________ Movements: ____________ Start time: ____________ Finish time: ____________ °Date: ____________ Movements: ____________ Start time: ____________ Finish time: ____________ °Date: ____________ Movements: ____________ Start time: ____________ Finish time: ____________ °Date: ____________ Movements: ____________ Start time: ____________ Finish time: ____________ °Document Released: 09/12/2006 Document Revised: 12/28/2013 Document Reviewed: 06/09/2012 °Exit[employer]® Patient Information ©2015 [employer]. This information is not intended to replace advice given to you by your health care provider. Make sure you discuss any questions you have with your health care provider. ° °

## 2014-10-13 NOTE — Progress Notes (Signed)
C/o of intermittent pelvic pressure.  C/o of difficulty sleeping and requests [employer].

## 2014-10-13 NOTE — Progress Notes (Signed)
Rec good sleep habits, comfort measures, Benadryl.

## 2014-10-19 ENCOUNTER — Other Ambulatory Visit: Payer: Self-pay | Admitting: Obstetrics & Gynecology

## 2014-10-19 ENCOUNTER — Ambulatory Visit (INDEPENDENT_AMBULATORY_CARE_PROVIDER_SITE_OTHER): Payer: Medicaid Other | Admitting: Obstetrics & Gynecology

## 2014-10-19 VITALS — BP 117/72 | HR 72 | Temp 98.0°F | Wt 152.1 lb

## 2014-10-19 DIAGNOSIS — Z3403 Encounter for supervision of normal first pregnancy, third trimester: Secondary | ICD-10-CM

## 2014-10-19 LAB — POCT URINALYSIS DIP (DEVICE)
Bilirubin Urine: NEGATIVE
Glucose, UA: NEGATIVE mg/dL
Hgb urine dipstick: NEGATIVE
NITRITE: NEGATIVE
PH: 6 (ref 5.0–8.0)
PROTEIN: 100 mg/dL — AB
Specific Gravity, Urine: >=1.03 (ref 1.005–1.030)
UROBILINOGEN UA: 0.2 mg/dL (ref 0.0–1.0)

## 2014-10-19 LAB — OB RESULTS CONSOLE GC/CHLAMYDIA
Chlamydia: NEGATIVE
Gonorrhea: NEGATIVE

## 2014-10-19 LAB — OB RESULTS CONSOLE GBS: GBS: NEGATIVE

## 2014-10-19 NOTE — Progress Notes (Signed)
GBS and GC CT done

## 2014-10-19 NOTE — Progress Notes (Signed)
Patient reports pelvic pressure & sharp vaginal pains

## 2014-10-19 NOTE — Patient Instructions (Signed)

## 2014-10-20 LAB — GC/CHLAMYDIA PROBE AMP
CT Probe RNA: NEGATIVE
GC Probe RNA: NEGATIVE

## 2014-10-21 LAB — CULTURE, BETA STREP (GROUP B ONLY)

## 2014-10-24 ENCOUNTER — Encounter (HOSPITAL_COMMUNITY): Payer: Self-pay | Admitting: Emergency Medicine

## 2014-10-24 ENCOUNTER — Emergency Department (HOSPITAL_COMMUNITY)
Admission: EM | Admit: 2014-10-24 | Discharge: 2014-10-24 | Disposition: A | Discharge status: Home / Self Care | Payer: Medicaid Other | Attending: Emergency Medicine | Admitting: Emergency Medicine

## 2014-10-24 DIAGNOSIS — Y929 Unspecified place or not applicable: Secondary | ICD-10-CM | POA: Insufficient documentation

## 2014-10-24 DIAGNOSIS — Y998 Other external cause status: Secondary | ICD-10-CM | POA: Insufficient documentation

## 2014-10-24 DIAGNOSIS — Y9389 Activity, other specified: Secondary | ICD-10-CM | POA: Diagnosis not present

## 2014-10-24 DIAGNOSIS — X58XXXA Exposure to other specified factors, initial encounter: Secondary | ICD-10-CM | POA: Insufficient documentation

## 2014-10-24 DIAGNOSIS — Z3A32 32 weeks gestation of pregnancy: Secondary | ICD-10-CM | POA: Diagnosis not present

## 2014-10-24 DIAGNOSIS — S0181XA Laceration without foreign body of other part of head, initial encounter: Secondary | ICD-10-CM | POA: Insufficient documentation

## 2014-10-24 DIAGNOSIS — O9A213 Injury, poisoning and certain other consequences of external causes complicating pregnancy, third trimester: Secondary | ICD-10-CM | POA: Diagnosis not present

## 2014-10-24 NOTE — Discharge Instructions (Signed)
Apply neosporin or vaseline to wound to decrease risk of infection.

## 2014-10-24 NOTE — ED Notes (Signed)
Pt c/o small abrasion on head that keep oozing blood; pt is 8 months pregnant

## 2014-10-24 NOTE — ED Provider Notes (Signed)
CSN: [id number]     Arrival date & time 10/24/14  1839 History  This chart was scribed for non-physician practitioner, Fayrene HelperBowie Oviya Ammar, PA-C,working with Vida RollerBrian D Miller, MD, by Karle PlumberJennifer Tensley, ED Scribe. This patient was seen in room TR07C/TR07C and the patient's care was started at 8:08 PM.  Chief Complaint  Patient presents with  . Abrasion   The history is provided by the patient and medical records. No language interpreter was used.    HPI Comments:  Emily Frazier is a 19 y.o. female at 228 months gestation who presents to the Emergency Department complaining of an abrasion to her head that began about four hours ago. She states she scratched her head and it began "gushing" blood and would not stop. She states the bleeding has now slowed. Denies modifying factors. Denies dizziness, HA or light-headedness. Denies clotting disorders.  History reviewed. No pertinent past medical history. History reviewed. No pertinent past surgical history. History reviewed. No pertinent family history. History  Substance Use Topics  . Smoking status: Never Smoker   . Smokeless tobacco: Never Used  . Alcohol Use: No   OB History    Gravida Para Term Preterm AB TAB SAB Ectopic Multiple Living   1              Review of Systems  Skin: Positive for wound.    Allergies  Review of patient's allergies indicates no known allergies.  Home Medications   Prior to Admission medications   Not on File   Triage Vitals: BP 133/92 mmHg  Temp(Src) 98.2 F (36.8 C) (Oral)  Resp 16  Ht 5\' 7"  (1.702 m)  Wt 150 lb (68.04 kg)  BMI 23.49 kg/m2  LMP 02/09/2014 Physical Exam  Constitutional: She is oriented to person, place, and time. She appears well-developed and well-nourished.  HENT:  Head: Normocephalic.  Small scab noted to left forehead. No active bleeding. No surrounding erythema.  Eyes: EOM are normal.  Neck: Normal range of motion.  Cardiovascular: Normal rate.   Pulmonary/Chest: Effort normal.   Musculoskeletal: Normal range of motion.  Neurological: She is alert and oriented to person, place, and time.  Skin: Skin is warm and dry.  Psychiatric: She has a normal mood and affect. Her behavior is normal.  Nursing note and vitals reviewed.   ED Course  Procedures (including critical care time) DIAGNOSTIC STUDIES:   COORDINATION OF CARE: 8:10 PM- Advised pt that since the bleeding had now stopped there is no indication to suture wound. Pt verbalizes understanding and agrees to plan.  Medications - No data to display  Labs Review Labs Reviewed - No data to display  Imaging Review No results found.   EKG Interpretation None      MDM   Final diagnoses:  Laceration of skin of forehead, initial encounter    BP 133/92 mmHg  Temp(Src) 98.2 F (36.8 C) (Oral)  Resp 16  Ht 5\' 7"  (1.702 m)  Wt 150 lb (68.04 kg)  BMI 23.49 kg/m2  LMP 02/09/2014   I personally performed the services described in this documentation, which was scribed in my presence. The recorded information has been reviewed and is accurate.    Fayrene HelperBowie Dariya Gainer, PA-C 10/24/14 2035  Vida RollerBrian D Miller, MD 10/25/14 2010

## 2014-10-24 NOTE — ED Notes (Signed)
EDC 11-16-14, G1, no complications with this pregnancy, no vaginal bleeding, leaking fluid.  + fetal movements.

## 2014-10-24 NOTE — ED Notes (Signed)
PA assessed and discharged pt before nurse assessed.

## 2014-10-24 NOTE — ED Notes (Signed)
Pt scratched forehead, oozed blood x 3 hours.  No bleeding at this time.  Abrasion size of pencil eraser on left forehead.

## 2014-10-26 ENCOUNTER — Ambulatory Visit (INDEPENDENT_AMBULATORY_CARE_PROVIDER_SITE_OTHER): Payer: Medicaid Other | Admitting: Physician Assistant

## 2014-10-26 VITALS — BP 120/88 | HR 82 | Temp 98.0°F | Wt 154.0 lb

## 2014-10-26 DIAGNOSIS — O163 Unspecified maternal hypertension, third trimester: Secondary | ICD-10-CM

## 2014-10-26 DIAGNOSIS — O133 Gestational [pregnancy-induced] hypertension without significant proteinuria, third trimester: Secondary | ICD-10-CM

## 2014-10-26 DIAGNOSIS — Z3403 Encounter for supervision of normal first pregnancy, third trimester: Secondary | ICD-10-CM

## 2014-10-26 LAB — COMPREHENSIVE METABOLIC PANEL
ALBUMIN: 3.2 g/dL — AB (ref 3.5–5.2)
ALT: 8 U/L (ref 0–35)
AST: 16 U/L (ref 0–37)
Alkaline Phosphatase: 139 U/L — ABNORMAL HIGH (ref 39–117)
BILIRUBIN TOTAL: 0.2 mg/dL (ref 0.2–1.2)
BUN: 10 mg/dL (ref 6–23)
CALCIUM: 8.7 mg/dL (ref 8.4–10.5)
CO2: 23 meq/L (ref 19–32)
Chloride: 105 mEq/L (ref 96–112)
Creat: 0.62 mg/dL (ref 0.50–1.10)
Glucose, Bld: 74 mg/dL (ref 70–99)
Potassium: 4.4 mEq/L (ref 3.5–5.3)
Sodium: 138 mEq/L (ref 135–145)
TOTAL PROTEIN: 6.2 g/dL (ref 6.0–8.3)

## 2014-10-26 LAB — CBC WITH DIFFERENTIAL/PLATELET
Basophils Absolute: 0 10*3/uL (ref 0.0–0.1)
Basophils Relative: 0 % (ref 0–1)
EOS ABS: 0.1 10*3/uL (ref 0.0–0.7)
Eosinophils Relative: 2 % (ref 0–5)
HEMATOCRIT: 31.7 % — AB (ref 36.0–46.0)
Hemoglobin: 10.6 g/dL — ABNORMAL LOW (ref 12.0–15.0)
LYMPHS ABS: 1.8 10*3/uL (ref 0.7–4.0)
LYMPHS PCT: 26 % (ref 12–46)
MCH: 30.6 pg (ref 26.0–34.0)
MCHC: 33.4 g/dL (ref 30.0–36.0)
MCV: 91.6 fL (ref 78.0–100.0)
MONOS PCT: 10 % (ref 3–12)
MPV: 11.1 fL (ref 8.6–12.4)
Monocytes Absolute: 0.7 10*3/uL (ref 0.1–1.0)
NEUTROS PCT: 62 % (ref 43–77)
Neutro Abs: 4.2 10*3/uL (ref 1.7–7.7)
Platelets: 208 10*3/uL (ref 150–400)
RBC: 3.46 MIL/uL — ABNORMAL LOW (ref 3.87–5.11)
RDW: 13.4 % (ref 11.5–15.5)
WBC: 6.8 10*3/uL (ref 4.0–10.5)

## 2014-10-26 LAB — POCT URINALYSIS DIP (DEVICE)
Bilirubin Urine: NEGATIVE
Glucose, UA: NEGATIVE mg/dL
HGB URINE DIPSTICK: NEGATIVE
KETONES UR: NEGATIVE mg/dL
Leukocytes, UA: NEGATIVE
Nitrite: NEGATIVE
PH: 7 (ref 5.0–8.0)
Protein, ur: 100 mg/dL — AB
Specific Gravity, Urine: >=1.03 (ref 1.005–1.030)
Urobilinogen, UA: 1 mg/dL (ref 0.0–1.0)

## 2014-10-26 NOTE — Progress Notes (Signed)
37 weeks, without complaint.  Denies LOF, dysuria, vag bleeding.  Endorses good fetal movement.  Small for gestational age.  Will obtain u/s. Consistently has 1+protein in urine x several visits.  Today with elevated bp x 1.  Will obtain baseline PIH labs with 24 hour urine.   RTC for ROB in 1 week.   Labor precautions discussed.

## 2014-10-26 NOTE — Patient Instructions (Signed)
Third Trimester of Pregnancy The third trimester is from week 29 through week 42, months 7 through 9. The third trimester is a time when the fetus is growing rapidly. At the end of the ninth month, the fetus is about 20 inches in length and weighs 6-10 pounds.  BODY CHANGES Your body goes through many changes during pregnancy. The changes vary from woman to woman.   Your weight will continue to increase. You can expect to gain 25-35 pounds (11-16 kg) by the end of the pregnancy.  You may begin to get stretch marks on your hips, abdomen, and breasts.  You may urinate more often because the fetus is moving lower into your pelvis and pressing on your bladder.  You may develop or continue to have heartburn as a result of your pregnancy.  You may develop constipation because certain hormones are causing the muscles that push waste through your intestines to slow down.  You may develop hemorrhoids or swollen, bulging veins (varicose veins).  You may have pelvic pain because of the weight gain and pregnancy hormones relaxing your joints between the bones in your pelvis. Backaches may result from overexertion of the muscles supporting your posture.  You may have changes in your hair. These can include thickening of your hair, rapid growth, and changes in texture. Some women also have hair loss during or after pregnancy, or hair that feels dry or thin. Your hair will most likely return to normal after your baby is born.  Your breasts will continue to grow and be tender. A yellow discharge may leak from your breasts called colostrum.  Your belly button may stick out.  You may feel short of breath because of your expanding uterus.  You may notice the fetus "dropping," or moving lower in your abdomen.  You may have a bloody mucus discharge. This usually occurs a few days to a week before labor begins.  Your cervix becomes thin and soft (effaced) near your due date. WHAT TO EXPECT AT YOUR PRENATAL  EXAMS  You will have prenatal exams every 2 weeks until week 36. Then, you will have weekly prenatal exams. During a routine prenatal visit:  You will be weighed to make sure you and the fetus are growing normally.  Your blood pressure is taken.  Your abdomen will be measured to track your baby's growth.  The fetal heartbeat will be listened to.  Any test results from the previous visit will be discussed.  You may have a cervical check near your due date to see if you have effaced. At around 36 weeks, your caregiver will check your cervix. At the same time, your caregiver will also perform a test on the secretions of the vaginal tissue. This test is to determine if a type of bacteria, Group B streptococcus, is present. Your caregiver will explain this further. Your caregiver may ask you:  What your birth plan is.  How you are feeling.  If you are feeling the baby move.  If you have had any abnormal symptoms, such as leaking fluid, bleeding, severe headaches, or abdominal cramping.  If you have any questions. Other tests or screenings that may be performed during your third trimester include:  Blood tests that check for low iron levels (anemia).  Fetal testing to check the health, activity level, and growth of the fetus. Testing is done if you have certain medical conditions or if there are problems during the pregnancy. FALSE LABOR You may feel small, irregular contractions that   eventually go away. These are called Braxton Hicks contractions, or false labor. Contractions may last for hours, days, or even weeks before true labor sets in. If contractions come at regular intervals, intensify, or become painful, it is best to be seen by your caregiver.  SIGNS OF LABOR   Menstrual-like cramps.  Contractions that are 5 minutes apart or less.  Contractions that start on the top of the uterus and spread down to the lower abdomen and back.  A sense of increased pelvic pressure or back  pain.  A watery or bloody mucus discharge that comes from the vagina. If you have any of these signs before the 37th week of pregnancy, call your caregiver right away. You need to go to the hospital to get checked immediately. HOME CARE INSTRUCTIONS   Avoid all smoking, herbs, alcohol, and unprescribed drugs. These chemicals affect the formation and growth of the baby.  Follow your caregiver's instructions regarding medicine use. There are medicines that are either safe or unsafe to take during pregnancy.  Exercise only as directed by your caregiver. Experiencing uterine cramps is a good sign to stop exercising.  Continue to eat regular, healthy meals.  Wear a good support bra for breast tenderness.  Do not use hot tubs, steam rooms, or saunas.  Wear your seat belt at all times when driving.  Avoid raw meat, uncooked cheese, cat litter boxes, and soil used by cats. These carry germs that can cause birth defects in the baby.  Take your prenatal vitamins.  Try taking a stool softener (if your caregiver approves) if you develop constipation. Eat more high-fiber foods, such as fresh vegetables or fruit and whole grains. Drink plenty of fluids to keep your urine clear or pale yellow.  Take warm sitz baths to soothe any pain or discomfort caused by hemorrhoids. Use hemorrhoid cream if your caregiver approves.  If you develop varicose veins, wear support hose. Elevate your feet for 15 minutes, 3-4 times a day. Limit salt in your diet.  Avoid heavy lifting, wear low heal shoes, and practice good posture.  Rest a lot with your legs elevated if you have leg cramps or low back pain.  Visit your dentist if you have not gone during your pregnancy. Use a soft toothbrush to brush your teeth and be gentle when you floss.  A sexual relationship may be continued unless your caregiver directs you otherwise.  Do not travel far distances unless it is absolutely necessary and only with the approval  of your caregiver.  Take prenatal classes to understand, practice, and ask questions about the labor and delivery.  Make a trial run to the hospital.  Pack your hospital bag.  Prepare the baby's nursery.  Continue to go to all your prenatal visits as directed by your caregiver. SEEK MEDICAL CARE IF:  You are unsure if you are in labor or if your water has broken.  You have dizziness.  You have mild pelvic cramps, pelvic pressure, or nagging pain in your abdominal area.  You have persistent nausea, vomiting, or diarrhea.  You have a bad smelling vaginal discharge.  You have pain with urination. SEEK IMMEDIATE MEDICAL CARE IF:   You have a fever.  You are leaking fluid from your vagina.  You have spotting or bleeding from your vagina.  You have severe abdominal cramping or pain.  You have rapid weight loss or gain.  You have shortness of breath with chest pain.  You notice sudden or extreme swelling   of your face, hands, ankles, feet, or legs.  You have not felt your baby move in over an hour.  You have severe headaches that do not go away with medicine.  You have vision changes. Document Released: 08/07/2001 Document Revised: 08/18/2013 Document Reviewed: 10/14/2012 ExitCare Patient Information 2015 [employer]. This information is not intended to replace advice given to you by your health care provider. Make sure you discuss any questions you have with your health care provider.  

## 2014-10-27 ENCOUNTER — Encounter (HOSPITAL_COMMUNITY): Payer: Self-pay

## 2014-10-27 ENCOUNTER — Inpatient Hospital Stay (HOSPITAL_COMMUNITY)
Admission: AD | Admit: 2014-10-27 | Discharge: 2014-10-27 | Disposition: A | Discharge status: Home / Self Care | Payer: Medicaid Other | Source: Ambulatory Visit | Attending: Family Medicine | Admitting: Family Medicine

## 2014-10-27 ENCOUNTER — Other Ambulatory Visit: Payer: Self-pay | Admitting: Physician Assistant

## 2014-10-27 ENCOUNTER — Ambulatory Visit (HOSPITAL_COMMUNITY)
Admission: RE | Admit: 2014-10-27 | Discharge: 2014-10-27 | Disposition: A | Discharge status: Home / Self Care | Payer: Medicaid Other | Source: Ambulatory Visit | Attending: Physician Assistant | Admitting: Physician Assistant

## 2014-10-27 DIAGNOSIS — Z3A37 37 weeks gestation of pregnancy: Secondary | ICD-10-CM | POA: Insufficient documentation

## 2014-10-27 DIAGNOSIS — O26893 Other specified pregnancy related conditions, third trimester: Principal | ICD-10-CM

## 2014-10-27 DIAGNOSIS — Z3403 Encounter for supervision of normal first pregnancy, third trimester: Secondary | ICD-10-CM

## 2014-10-27 DIAGNOSIS — O133 Gestational [pregnancy-induced] hypertension without significant proteinuria, third trimester: Secondary | ICD-10-CM

## 2014-10-27 DIAGNOSIS — N898 Other specified noninflammatory disorders of vagina: Secondary | ICD-10-CM

## 2014-10-27 DIAGNOSIS — O365990 Maternal care for other known or suspected poor fetal growth, unspecified trimester, not applicable or unspecified: Secondary | ICD-10-CM | POA: Insufficient documentation

## 2014-10-27 LAB — PROTEIN / CREATININE RATIO, URINE
CREATININE, URINE: 206.4 mg/dL
PROTEIN CREATININE RATIO: 0.26 — AB (ref <0.15)
TOTAL PROTEIN, URINE: 53 mg/dL — AB (ref 5–24)

## 2014-10-27 LAB — POCT FERN TEST: POCT FERN TEST: NEGATIVE

## 2014-10-27 NOTE — Discharge Instructions (Signed)

## 2014-10-27 NOTE — MAU Note (Signed)
Leaking clear fluid since 3 am this morning. Denies vaginal bleeding. Denies contractions or pain. Positive fetal movement.

## 2014-10-27 NOTE — MAU Provider Note (Signed)
History     CSN: [id number]  Arrival date and time: 10/27/14 0510  Seen by provider at 0520 on 10/27/14    Chief Complaint  Patient presents with  . Rupture of Membranes   HPI Emily Frazier is a 18yo female G1P0 at 37 weeks and 1 day presenting for leaking of clear fluid since 3 am this morning. States she had sexual intercourse between 2 and 2:30am and noted clear leakage of fluid shortly after. Minimal amount of fluid noted.  Denies vaginal bleeding or discharge. Denies contractions. Continues to note fetal movement. Denies blurred vision, chest pain, headache, edema.  Recently seen in clinic on 10/26/14 and noted to have elevated BP of 145/81. PIH labs showed normal CMP, CBC with hemoglobin 10.6, and protein creatinine ratio 0.26. Instructed to collect 24 hour urine.   OB History    Gravida Para Term Preterm AB TAB SAB Ectopic Multiple Living   1               Past Medical History  Diagnosis Date  . Medical history non-contributory     Past Surgical History  Procedure Laterality Date  . No past surgeries      History reviewed. No pertinent family history.  History  Substance Use Topics  . Smoking status: Never Smoker   . Smokeless tobacco: Never Used  . Alcohol Use: No    Allergies: No Known Allergies  No prescriptions prior to admission    Review of Systems  All other systems reviewed and are negative.  Physical Exam   Blood pressure 157/102, pulse 77, temperature 98.3 F (36.8 C), temperature source Oral, resp. rate 18, height 5\' 7"  (1.702 m), weight 69.491 kg (153 lb 3.2 oz), last menstrual period 02/09/2014.  Physical Exam  Constitutional: She appears well-nourished. No distress.  Cardiovascular: Exam reveals no gallop and no friction rub.   No murmur heard. Respiratory: Breath sounds normal. No respiratory distress. She has no wheezes. She has no rales.  Genitourinary:  Sterile speculum exam showed no pooling of fluid  Fetal Monitor: baseline 130bpm,  moderate variability, + accelerations, no decelerations, no contractions Dilation: 1 Effacement (%): 70 Cervical Position: Posterior Station: -3 Presentation: Vertex Exam by:: Judeth HornErin Lawrence RN  MAU Course  Procedures  MDM - BP initially elevated to 161/98 at presentation, but trended down to 129/89. Workup done yesterday (3/1) and 24hour urine pending, so will not collect PIH labs at this time. Monitored x1 hr with improvement. - Sterile speculum exam showed no pooling of fluid. Sample of fluid obtained for ferning. - Ferning negative. Multiple sperm noted.   Assessment and Plan  - Follow up with 24hr urine at clinic today. Will check BP at this visit. - Stable for discharge - Return with change in amount of fluid leaked or with regular contractions or vaginal bleeding  Araceli BoucheRumley, Vinton N 10/27/2014, 5:29 AM   I was consulted RE: POC and reviewed VS, labs, Hx and agree with above. W/ only one severe-range BP, normal recent labs, no other evidence of Pre-E and unfavorable cervix will continue w/ Plan for 24 urine and F/U in clinic later today.   Patient Vitals for the past 24 hrs:  BP Temp Temp src Pulse Resp Height Weight  10/27/14 0657 127/95 mmHg - - 60 - - -  10/27/14 0642 124/82 mmHg - - 68 - - -  10/27/14 0626 137/87 mmHg - - 76 - - -  10/27/14 0612 116/82 mmHg - - 75 - - -  10/27/14 0557 131/89 mmHg - - 68 - - -  10/27/14 0541 124/92 mmHg - - 90 - - -  10/27/14 0527 129/89 mmHg - - 77 - - -  10/27/14 0526 (!) 157/102 mmHg - - 77 - - -  10/27/14 0522 161/98 mmHg 98.3 F (36.8 C) Oral 69 18 - -  10/27/14 0517 - - - - -  (1.702 m) 153 lb 3.2 oz (69.491 kg)   Dorathy Kinsman, CNM 10/27/2014 10:40 PM

## 2014-10-28 ENCOUNTER — Telehealth: Payer: Self-pay | Admitting: *Deleted

## 2014-10-28 ENCOUNTER — Ambulatory Visit (INDEPENDENT_AMBULATORY_CARE_PROVIDER_SITE_OTHER): Payer: Medicaid Other | Admitting: *Deleted

## 2014-10-28 ENCOUNTER — Inpatient Hospital Stay (HOSPITAL_COMMUNITY)
Admission: AD | Admit: 2014-10-28 | Discharge: 2014-10-31 | DRG: 774 | Disposition: A | Discharge status: Home / Self Care | Payer: Medicaid Other | Source: Ambulatory Visit | Attending: Obstetrics & Gynecology | Admitting: Obstetrics & Gynecology

## 2014-10-28 ENCOUNTER — Encounter (HOSPITAL_COMMUNITY): Payer: Self-pay | Admitting: *Deleted

## 2014-10-28 VITALS — BP 110/75 | HR 88

## 2014-10-28 DIAGNOSIS — O1494 Unspecified pre-eclampsia, complicating childbirth: Secondary | ICD-10-CM | POA: Diagnosis present

## 2014-10-28 DIAGNOSIS — Z3A37 37 weeks gestation of pregnancy: Secondary | ICD-10-CM | POA: Diagnosis present

## 2014-10-28 DIAGNOSIS — O1493 Unspecified pre-eclampsia, third trimester: Principal | ICD-10-CM | POA: Diagnosis present

## 2014-10-28 DIAGNOSIS — O365931 Maternal care for other known or suspected poor fetal growth, third trimester, fetus 1: Secondary | ICD-10-CM

## 2014-10-28 DIAGNOSIS — O365930 Maternal care for other known or suspected poor fetal growth, third trimester, not applicable or unspecified: Secondary | ICD-10-CM | POA: Diagnosis present

## 2014-10-28 DIAGNOSIS — Z3403 Encounter for supervision of normal first pregnancy, third trimester: Secondary | ICD-10-CM

## 2014-10-28 DIAGNOSIS — Z3490 Encounter for supervision of normal pregnancy, unspecified, unspecified trimester: Secondary | ICD-10-CM | POA: Insufficient documentation

## 2014-10-28 LAB — CBC
HCT: 31.5 % — ABNORMAL LOW (ref 36.0–46.0)
Hemoglobin: 10.9 g/dL — ABNORMAL LOW (ref 12.0–15.0)
MCH: 31.2 pg (ref 26.0–34.0)
MCHC: 34.6 g/dL (ref 30.0–36.0)
MCV: 90.3 fL (ref 78.0–100.0)
Platelets: 206 10*3/uL (ref 150–400)
RBC: 3.49 MIL/uL — ABNORMAL LOW (ref 3.87–5.11)
RDW: 12.5 % (ref 11.5–15.5)
WBC: 8.3 10*3/uL (ref 4.0–10.5)

## 2014-10-28 LAB — PROTEIN, URINE, 24 HOUR
Protein, 24H Urine: 358 mg/d — ABNORMAL HIGH (ref <150)
Protein, Urine: 53 mg/dL — ABNORMAL HIGH (ref 5–24)

## 2014-10-28 MED ORDER — ZOLPIDEM TARTRATE 5 MG PO TABS
5.0000 mg | ORAL_TABLET | Freq: Every evening | ORAL | Status: DC | PRN
  Administered 2014-10-29: 5 mg via ORAL
  Filled 2014-10-28: qty 1

## 2014-10-28 MED ORDER — ONDANSETRON HCL 4 MG/2ML IJ SOLN: 4.0000 mg | Freq: Four times a day (QID) | INTRAMUSCULAR | Status: DC | PRN

## 2014-10-28 MED ORDER — LIDOCAINE HCL (PF) 1 % IJ SOLN
30.0000 mL | INTRAMUSCULAR | Status: DC | PRN
  Filled 2014-10-28: qty 30

## 2014-10-28 MED ORDER — LACTATED RINGERS IV SOLN
INTRAVENOUS | Status: DC
  Administered 2014-10-28: 125 mL/h via INTRAVENOUS
  Administered 2014-10-29: 03:00:00 via INTRAVENOUS

## 2014-10-28 MED ORDER — OXYTOCIN 40 UNITS IN LACTATED RINGERS INFUSION - SIMPLE MED
1.0000 m[IU]/min | INTRAVENOUS | Status: DC
  Administered 2014-10-29: 2 m[IU]/min via INTRAVENOUS
  Filled 2014-10-28: qty 1000

## 2014-10-28 MED ORDER — OXYTOCIN BOLUS FROM INFUSION: 500.0000 mL | INTRAVENOUS | Status: DC

## 2014-10-28 MED ORDER — ACETAMINOPHEN 325 MG PO TABS
650.0000 mg | ORAL_TABLET | ORAL | Status: DC | PRN
Start: 2014-10-28 — End: 2014-10-29

## 2014-10-28 MED ORDER — FENTANYL CITRATE 0.05 MG/ML IJ SOLN
50.0000 ug | INTRAMUSCULAR | Status: DC | PRN
  Administered 2014-10-28 – 2014-10-29 (×2): 50 ug via INTRAVENOUS
  Filled 2014-10-28 (×2): qty 2

## 2014-10-28 MED ORDER — TERBUTALINE SULFATE 1 MG/ML IJ SOLN: 0.2500 mg | Freq: Once | INTRAMUSCULAR | Status: AC | PRN

## 2014-10-28 MED ORDER — OXYTOCIN 40 UNITS IN LACTATED RINGERS INFUSION - SIMPLE MED: 62.5000 mL/h | INTRAVENOUS | Status: DC

## 2014-10-28 MED ORDER — FLEET ENEMA 7-19 GM/118ML RE ENEM: 1.0000 | ENEMA | RECTAL | Status: DC | PRN

## 2014-10-28 MED ORDER — CITRIC ACID-SODIUM CITRATE 334-500 MG/5ML PO SOLN: 30.0000 mL | ORAL | Status: DC | PRN

## 2014-10-28 MED ORDER — LACTATED RINGERS IV SOLN: 500.0000 mL | INTRAVENOUS | Status: DC | PRN

## 2014-10-28 MED ORDER — OXYCODONE-ACETAMINOPHEN 5-325 MG PO TABS: 1.0000 | ORAL_TABLET | ORAL | Status: DC | PRN

## 2014-10-28 MED ORDER — OXYCODONE-ACETAMINOPHEN 5-325 MG PO TABS: 2.0000 | ORAL_TABLET | ORAL | Status: DC | PRN

## 2014-10-28 MED ORDER — MISOPROSTOL 50MCG HALF TABLET
50.0000 ug | ORAL_TABLET | Freq: Once | ORAL | Status: DC
  Filled 2014-10-28: qty 1

## 2014-10-28 NOTE — Progress Notes (Signed)
Pt reports severe H/A last night and a milder H/A earlier today. She denies H/A now. Lab results reviewed from last 2 days. [facility] Dr[facility]inson [facility] - plan for direct admit today for IOL due to pre-eclampsia with severe features and IUGR.  Pt permitted to leave hospital to gather her belongings and return for admit in approximately 1 hour.

## 2014-10-28 NOTE — H&P (Signed)
Emily Frazier is a 19 y.o. female G1P0000 with IUP at [redacted]w[redacted]d presenting for IOL for preeclampsia and IUGR. She has had proteinuria since 35 weeks on dipstick; 24 hour urine 3/2 showed /day.  Additionally, she had a headache on 3/2.  EFW 3/2 is 4# 15 oz (<10%) Membranes are intact, with active fetal movement.   [facility] w[facility]  Prenatal History/Complications:  As above Late [facility]  Past Medical History: Past Medical History  Diagnosis Date  . Medical history non-contributory     Past Surgical History: Past Surgical History  Procedure Laterality Date  . No past surgeries    . Wisdom tooth extraction      Obstetrical History: OB History    Gravida Para Term Preterm AB TAB SAB Ectopic Multiple Living         Social History: History   Social History  . Marital Status: Married    Spouse Name: N/A  . Number of Children: N/A  . Years of Education: N/A   Social History Main Topics  . Smoking status: Never Smoker   . Smokeless tobacco: Never Used  . Alcohol Use: No  . Drug Use: No  . Sexual Activity: Yes   Other Topics Concern  . None   Social History Narrative    Family History: History reviewed. No pertinent family history.  Allergies: No Known Allergies  No prescriptions prior to admission     Prenatal Transfer Tool  Maternal Diabetes: No Genetic Screening: Normal Maternal Ultrasounds/Referrals: Abnormal:  Findings:   IUGR Fetal Ultrasounds or other Referrals:  None Maternal Substance Abuse:  No Significant Maternal Medications:  None Significant Maternal Lab Results: None     Review of Systems   Constitutional: Negative for fever and chills Eyes: Negative for visual disturbances Respiratory: Negative for shortness of breath, dyspnea Cardiovascular: Negative for chest pain or palpitations  Gastrointestinal: Negative for vomiting, diarrhea and constipation.  POSITIVE for abdominal pain  (contractions) Genitourinary: Negative for dysuria and urgency Musculoskeletal: Negative for back pain, joint pain, myalgias  Neurological: Negative for dizziness and headaches      Blood pressure 121/78, pulse 111, temperature 98.9 F (37.2 C), temperature source Oral, resp. rate 20, height  (1.702 m), weight 69.854 kg (154 lb), last menstrual period 02/09/2014. General appearance: alert, cooperative and no distress Lungs: clear to auscultation bilaterally Heart: regular rate and rhythm Abdomen: soft, non-tender; bowel sounds normal Pelvic: 1-2/60/-1 Extremities: Homans sign is negative, no sign of DVT DTR's 2+ Presentation: cephalic Fetal monitoring  Baseline: 145 bpm, Variability: Good {> 6 bpm), Accelerations: Reactive and Decelerations: Absent Uterine activity  None     Prenatal labs: ABO, Rh: O/Positive/-- (08/17 0000) Antibody: Negative (08/17 0000) Rubella:   RPR: NON REAC (12/18 1101)  HBsAg: Negative (08/17 0000)  HIV: NONREACTIVE (12/18 1101)  GBS: Negative (02/23 0000)  1 hr Glucola 91 Genetic screening  Normal quad Anatomy US normal   Results for orders placed or performed during the hospital encounter of 10/28/14 (from the past 24 hour(s))  CBC   Collection Time: 10/28/14  7:12 PM  Result Value Ref Range   WBC 8.3 4.0 - 10.5 K/uL   RBC 3.49 (L) 3.87 - 5.11 MIL/uL   Hemoglobin 10.9 (L) 12.0 - 15.0 g/dL   HCT 16.1 (L) 09.6 - 04.5 %   MCV 90.3 78.0 - 100.0 fL   MCH 31.2 26.0 - 34.0 pg   MCHC  34.6 30.0 - 36.0 g/dL   RDW 12.5 81.111.5 - 91.415.540.9 %   Platelets 206 150 - 400 K/uL    Assessment: Emily Frazier is a 19 y.o. G1P0000 with an IUP at 7026w2d presenting for IOL for preeclampsia and IUGR  Plan: #Labor: Foley placed.  Plan oral cytotec and pitocin when foley falls out #Pain:  IV/epidural prn #FWB  Cat 1 #ID: GBS: neg  #MOF:  breast #MOC: none #Circ: outpt   CRESENZO-DISHMAN,Pritesh Sobecki 10/28/2014, 7:51 PM

## 2014-10-28 NOTE — Progress Notes (Signed)
   Emily Frazier is a 19 y.o. G1P0000 at 8357w2d  admitted for induction of labor due to Pre-eclamptic toxemia of pregnancy. and Poor fetal growth.  Subjective:  Feeling contractions, mild  Objective: Filed Vitals:   10/28/14 1834 10/28/14 2001  BP: 121/78 124/78  Pulse: 111 93  Temp: 98.9 F (37.2 C) 98.5 F (36.9 C)  TempSrc: Oral   Resp: 20 16  Height: 5\' 7"  (1.702 m)   Weight: 69.854 kg (154 lb)       FHT:  FHR: 125 bpm, variability: moderate,  accelerations:  Present,  decelerations:  Absent UC:   irregular, every 2-4 minutes SVE: deferred:  Foley still in   Labs: Lab Results  Component Value Date   WBC 8.3 10/28/2014   HGB 10.9* 10/28/2014   HCT 31.5* 10/28/2014   MCV 90.3 10/28/2014   PLT 206 10/28/2014    Assessment / Plan: IOL, ripening phase.  COntracting too often for oral cytotec Will start pitocin when foley falls out Labor: no Fetal Wellbeing:  Category I Pain Control:  Labor support without medications Anticipated MOD:  NSVD  CRESENZO-DISHMAN,Roy Snuffer 10/28/2014, 10:18 PM

## 2014-10-28 NOTE — Telephone Encounter (Addendum)
Called pt @ [phone] and heard message stating that the telephone number dialed is [phone] not in service. I then called Tine Heppelle (contact) and left a message on her personal voice mail. I asked her to contact Morrie Sheldonshley if possible and have her call us today regarding important information.  Morrie Sheldonshley needs to be informed that she needs NST today (tomorrow if today is not possible).   [phone]  Received a call from [employer] Heppelle (pt's mother). She stated that Derotha's phone is off right now. She wanted to know if it is an emergency. I stated no but I really would like to speak to LillieAshley today regarding a needed appt. Inetta Fermoina stated that she will go to Shian's house and have her call back today.   [phone]  Received call from KenaiAshley. I informed her of US results and need for NST & BP check today. Pt voiced understanding and agreed to appt today @ 1500.

## 2014-10-29 ENCOUNTER — Inpatient Hospital Stay (HOSPITAL_COMMUNITY): Payer: Medicaid Other | Admitting: Anesthesiology

## 2014-10-29 ENCOUNTER — Encounter (HOSPITAL_COMMUNITY): Payer: Self-pay | Admitting: Anesthesiology

## 2014-10-29 DIAGNOSIS — Z3A37 37 weeks gestation of pregnancy: Secondary | ICD-10-CM

## 2014-10-29 DIAGNOSIS — O1494 Unspecified pre-eclampsia, complicating childbirth: Secondary | ICD-10-CM | POA: Diagnosis present

## 2014-10-29 DIAGNOSIS — O1403 Mild to moderate pre-eclampsia, third trimester: Secondary | ICD-10-CM

## 2014-10-29 LAB — TYPE AND SCREEN
ABO/RH(D): O POS
Antibody Screen: NEGATIVE

## 2014-10-29 LAB — RPR: RPR Ser Ql: NONREACTIVE

## 2014-10-29 LAB — CBC
HCT: 30.9 % — ABNORMAL LOW (ref 36.0–46.0)
HEMATOCRIT: 32.6 % — AB (ref 36.0–46.0)
HEMOGLOBIN: 10.8 g/dL — AB (ref 12.0–15.0)
Hemoglobin: 11.1 g/dL — ABNORMAL LOW (ref 12.0–15.0)
MCH: 30.7 pg (ref 26.0–34.0)
MCH: 31.4 pg (ref 26.0–34.0)
MCHC: 34 g/dL (ref 30.0–36.0)
MCHC: 35 g/dL (ref 30.0–36.0)
MCV: 90.1 fL (ref 78.0–100.0)
MCV: 89.8 fL (ref 78.0–100.0)
PLATELETS: 180 10*3/uL (ref 150–400)
Platelets: 179 10*3/uL (ref 150–400)
RBC: 3.62 MIL/uL — ABNORMAL LOW (ref 3.87–5.11)
RBC: 3.44 MIL/uL — ABNORMAL LOW (ref 3.87–5.11)
RDW: 12.5 % (ref 11.5–15.5)
RDW: 12.5 % (ref 11.5–15.5)
WBC: 8.4 10*3/uL (ref 4.0–10.5)
WBC: 15.3 10*3/uL — AB (ref 4.0–10.5)

## 2014-10-29 LAB — ABO/RH: ABO/RH(D): O POS

## 2014-10-29 MED ORDER — OXYCODONE-ACETAMINOPHEN 5-325 MG PO TABS
1.0000 | ORAL_TABLET | ORAL | Status: DC | PRN
  Administered 2014-10-29: 1 via ORAL
  Filled 2014-10-29: qty 1

## 2014-10-29 MED ORDER — FENTANYL 2.5 MCG/ML BUPIVACAINE 1/10 % EPIDURAL INFUSION (WH - ANES)
14.0000 mL/h | INTRAMUSCULAR | Status: DC | PRN
  Administered 2014-10-29: 14 mL/h via EPIDURAL
  Filled 2014-10-29: qty 125

## 2014-10-29 MED ORDER — DIBUCAINE 1 % RE OINT: 1.0000 "application " | TOPICAL_OINTMENT | RECTAL | Status: DC | PRN

## 2014-10-29 MED ORDER — EPHEDRINE 5 MG/ML INJ
10.0000 mg | INTRAVENOUS | Status: DC | PRN
  Filled 2014-10-29: qty 2

## 2014-10-29 MED ORDER — SENNOSIDES-DOCUSATE SODIUM 8.6-50 MG PO TABS
2.0000 | ORAL_TABLET | ORAL | Status: DC
  Administered 2014-10-29 – 2014-10-30 (×2): 2 via ORAL
  Filled 2014-10-29 (×2): qty 2

## 2014-10-29 MED ORDER — DIPHENHYDRAMINE HCL 25 MG PO CAPS: 25.0000 mg | ORAL_CAPSULE | Freq: Four times a day (QID) | ORAL | Status: DC | PRN

## 2014-10-29 MED ORDER — DIPHENHYDRAMINE HCL 50 MG/ML IJ SOLN: 12.5000 mg | INTRAMUSCULAR | Status: DC | PRN

## 2014-10-29 MED ORDER — BENZOCAINE-MENTHOL 20-0.5 % EX AERO
1.0000 "application " | INHALATION_SPRAY | CUTANEOUS | Status: DC | PRN
  Administered 2014-10-29: 1 via TOPICAL
  Filled 2014-10-29: qty 56

## 2014-10-29 MED ORDER — WITCH HAZEL-GLYCERIN EX PADS: 1.0000 "application " | MEDICATED_PAD | CUTANEOUS | Status: DC | PRN

## 2014-10-29 MED ORDER — TETANUS-DIPHTH-ACELL PERTUSSIS 5-2.5-18.5 LF-MCG/0.5 IM SUSP: 0.5000 mL | Freq: Once | INTRAMUSCULAR | Status: DC

## 2014-10-29 MED ORDER — LIDOCAINE HCL (PF) 1 % IJ SOLN
INTRAMUSCULAR | Status: DC | PRN
  Administered 2014-10-29 (×2): 4 mL

## 2014-10-29 MED ORDER — LACTATED RINGERS IV SOLN
500.0000 mL | Freq: Once | INTRAVENOUS | Status: AC
  Administered 2014-10-29: 500 mL via INTRAVENOUS

## 2014-10-29 MED ORDER — OXYCODONE-ACETAMINOPHEN 5-325 MG PO TABS: 2.0000 | ORAL_TABLET | ORAL | Status: DC | PRN

## 2014-10-29 MED ORDER — ZOLPIDEM TARTRATE 5 MG PO TABS: 5.0000 mg | ORAL_TABLET | Freq: Every evening | ORAL | Status: DC | PRN

## 2014-10-29 MED ORDER — FENTANYL 2.5 MCG/ML BUPIVACAINE 1/10 % EPIDURAL INFUSION (WH - ANES)
INTRAMUSCULAR | Status: DC | PRN
  Administered 2014-10-29: 14 mL/h via EPIDURAL

## 2014-10-29 MED ORDER — LANOLIN HYDROUS EX OINT: TOPICAL_OINTMENT | CUTANEOUS | Status: DC | PRN

## 2014-10-29 MED ORDER — PRENATAL MULTIVITAMIN CH
1.0000 | ORAL_TABLET | Freq: Every day | ORAL | Status: DC
  Administered 2014-10-30 – 2014-10-31 (×2): 1 via ORAL
  Filled 2014-10-29 (×2): qty 1

## 2014-10-29 MED ORDER — PHENYLEPHRINE 40 MCG/ML (10ML) SYRINGE FOR IV PUSH (FOR BLOOD PRESSURE SUPPORT)
80.0000 ug | PREFILLED_SYRINGE | INTRAVENOUS | Status: DC | PRN
  Filled 2014-10-29: qty 2

## 2014-10-29 MED ORDER — PHENYLEPHRINE 40 MCG/ML (10ML) SYRINGE FOR IV PUSH (FOR BLOOD PRESSURE SUPPORT)
80.0000 ug | PREFILLED_SYRINGE | INTRAVENOUS | Status: DC | PRN
  Filled 2014-10-29: qty 2
  Filled 2014-10-29: qty 20

## 2014-10-29 MED ORDER — ONDANSETRON HCL 4 MG/2ML IJ SOLN: 4.0000 mg | INTRAMUSCULAR | Status: DC | PRN

## 2014-10-29 MED ORDER — IBUPROFEN 600 MG PO TABS
600.0000 mg | ORAL_TABLET | Freq: Four times a day (QID) | ORAL | Status: DC
  Administered 2014-10-29 – 2014-10-31 (×7): 600 mg via ORAL
  Filled 2014-10-29 (×7): qty 1

## 2014-10-29 MED ORDER — SIMETHICONE 80 MG PO CHEW: 80.0000 mg | CHEWABLE_TABLET | ORAL | Status: DC | PRN

## 2014-10-29 MED ORDER — ONDANSETRON HCL 4 MG PO TABS: 4.0000 mg | ORAL_TABLET | ORAL | Status: DC | PRN

## 2014-10-29 NOTE — Progress Notes (Signed)
   Emily Frazier is a 19 y.o. G1P0000 at 3864w3d  admitted for induction of labor due to preeclampsia/IUGR  Subjective: NO HA, visual change, RUQ pain  Objective: Filed Vitals:   10/29/14 0400 10/29/14 0505 10/29/14 0600 10/29/14 0700  BP: 133/73 142/90 142/80 145/86  Pulse: 62 63 59 66  Temp:    98.1 F (36.7 C)  TempSrc:    Oral  Resp:  18 16 16   Height:      Weight:          FHT:  FHR: 125 bpm, variability: moderate,  accelerations:  Present,  decelerations:  Absent UC:   none SVE:  Foley out.  Will check before starting pitocin Labs: Lab Results  Component Value Date   WBC 8.3 10/28/2014   HGB 10.9* 10/28/2014   HCT 31.5* 10/28/2014   MCV 90.3 10/28/2014   PLT 206 10/28/2014    Assessment / Plan: IOL, ripening phase complete.  Light breakfast then start pitocin.  MGSO4 if b/p becomes severe  Labor: Progressing normally Fetal Wellbeing:  Category I Pain Control:  Labor support without medications Anticipated MOD:  NSVD  CRESENZO-DISHMAN,Emily Frazier 10/29/2014, 8:09 AM

## 2014-10-29 NOTE — Anesthesia Preprocedure Evaluation (Signed)
Anesthesia Evaluation  Patient identified by MRN, date of birth, ID band Patient awake    Reviewed: Allergy & Precautions, Patient's Chart, lab work & pertinent test results  Airway Mallampati: II  TM Distance: >3 FB Neck ROM: Full    Dental no notable dental hx. (+) Teeth Intact   Pulmonary neg pulmonary ROS,  breath sounds clear to auscultation  Pulmonary exam normal       Cardiovascular hypertension, Rhythm:Regular Rate:Normal     Neuro/Psych negative neurological ROS  negative psych ROS   GI/Hepatic negative GI ROS, Neg liver ROS,   Endo/Other  negative endocrine ROS  Renal/GU negative Renal ROS  negative genitourinary   Musculoskeletal negative musculoskeletal ROS (+)   Abdominal   Peds  Hematology  (+) anemia ,   Anesthesia Other Findings   Reproductive/Obstetrics (+) Pregnancy 37 2/[redacted] weeks Gestational HTN                             Anesthesia Physical Anesthesia Plan  ASA: II  Anesthesia Plan: Epidural   Post-op Pain Management:    Induction:   Airway Management Planned: Natural Airway  Additional Equipment:   Intra-op Plan:   Post-operative Plan:   Informed Consent: I have reviewed the patients History and Physical, chart, labs and discussed the procedure including the risks, benefits and alternatives for the proposed anesthesia with the patient or authorized representative who has indicated his/her understanding and acceptance.   Dental advisory given  Plan Discussed with: Anesthesiologist  Anesthesia Plan Comments:         Anesthesia Quick Evaluation

## 2014-10-29 NOTE — Anesthesia Procedure Notes (Addendum)
Epidural Patient location during procedure: OB Start time: 10/29/2014 4:11 PM  Staffing Anesthesiologist: Leanthony Rhett A. Performed by: anesthesiologist   Preanesthetic Checklist Completed: patient identified, site marked, surgical consent, pre-op evaluation, timeout performed, IV checked, risks and benefits discussed and monitors and equipment checked  Epidural Patient position: sitting Prep: site prepped and draped and DuraPrep Patient monitoring: continuous pulse ox and blood pressure Approach: midline Location: L3-L4 Injection technique: LOR air  Needle:  Needle type: Tuohy  Needle gauge: 17 G Needle length: 9 cm and 9 Needle insertion depth: 4 cm Catheter type: closed end flexible Catheter size: 19 Gauge Catheter at skin depth: 9 cm Test dose: negative and Other  Assessment Events: blood not aspirated, injection not painful, no injection resistance, negative IV test and no paresthesia  Additional Notes Patient identified. Risks and benefits discussed including failed block, incomplete  Pain control, post dural puncture headache, nerve damage, paralysis, blood pressure Changes, nausea, vomiting, reactions to medications-both toxic and allergic and post Partum back pain. All questions were answered. Patient expressed understanding and wished to proceed. Sterile technique was used throughout procedure. Epidural site was Dressed with sterile barrier dressing. No paresthesias, signs of intravascular injection Or signs of intrathecal spread were encountered.  Patient was more comfortable after the epidural was dosed. Please see RN's note for documentation of vital signs and FHR which are stable.

## 2014-10-29 NOTE — Progress Notes (Signed)
Emily Frazier is a 19 y.o. G1P0000 at 3551w3d admitted for iOL due to preeclampsia/IUGR.   Filed Vitals:   10/29/14 1038  BP: 135/98  Pulse: 67  Temp:   Resp: 20  Cervical exam: 4/80/-2  at 10:30 AM  A: Comfortable, breathing easily.  P: Pitocin started

## 2014-10-29 NOTE — Progress Notes (Addendum)
Emily Frazier is a 19 y.o. G1P0000 at 6198w3d by admitted for IOL for pre-eclampsia symptoms and IUGR<10%.  Subjective: Notes mild and irregular contractions. Was able to ambulate. Notes history of headache, but states this has resolved. Denies chest pain, blurred vision.  Objective: BP 111/63 mmHg  Pulse 90  Temp(Src) 98.2 F (36.8 C) (Oral)  Resp 16  Ht 5\' 7"  (1.702 m)  Wt 69.854 kg (154 lb)  BMI 24.11 kg/m2  LMP 02/09/2014      FHT:  FHR: 130 bpm, variability: moderate,  accelerations:  Present,  decelerations:  Absent UC:   irregular SVE:   Dilation: 1.5 Effacement (%): 60 Station: -1 Exam by:: Adolph PollackFran Cresnzo-Dishmon, CNM  Labs: Lab Results  Component Value Date   WBC 8.3 10/28/2014   HGB 10.9* 10/28/2014   HCT 31.5* 10/28/2014   MCV 90.3 10/28/2014   PLT 206 10/28/2014    Assessment / Plan: Spontaneous labor, progressing normally  Labor: Progressing normally, Foley Bulb still in place. Will start Pitocin once Foley Bulb out. Preeclampsia:  no signs or symptoms of toxicity Fetal Wellbeing:  Category I Pain Control:  Labor support without medications I/D:  n/a Anticipated MOD:  NSVD   Araceli BoucheRumley, [place] N 10/29/2014, 2:37 AM

## 2014-10-30 LAB — CBC
HCT: 26.6 % — ABNORMAL LOW (ref 36.0–46.0)
Hemoglobin: 9.2 g/dL — ABNORMAL LOW (ref 12.0–15.0)
MCH: 31.1 pg (ref 26.0–34.0)
MCHC: 34.6 g/dL (ref 30.0–36.0)
MCV: 89.9 fL (ref 78.0–100.0)
Platelets: 172 10*3/uL (ref 150–400)
RBC: 2.96 MIL/uL — AB (ref 3.87–5.11)
RDW: 12.4 % (ref 11.5–15.5)
WBC: 11.5 10*3/uL — ABNORMAL HIGH (ref 4.0–10.5)

## 2014-10-30 NOTE — Lactation Note (Signed)
This note was copied from the chart of Emily Dolores Hooseshley Hjort. Lactation Consultation Note Mom has changed her mind and is just going to bottle formula feed. Discussed engorgement management and LPI care. Patient Name: Emily Frazier BJYNW'GToday's Date: 10/30/2014 Reason for consult: Initial assessment   Maternal Data Has patient been taught Hand Expression?: No Does the patient have breastfeeding experience prior to this delivery?: No  Feeding Feeding Type: Bottle Fed - Formula Nipple Type: Slow - flow  LATCH Score/Interventions                      Lactation Tools Discussed/Used     Consult Status Consult Status: Complete    Emily Frazier G 10/30/2014, 7:23 AM

## 2014-10-30 NOTE — Progress Notes (Signed)
Post Partum Day 1  Patient is 19 y.o. G1P0000 3075w3d admitted for IOL for pre-eclampsia & IUGR >10%, blood pressure remained appropriate throughout labor; no mag sulfate required.  At 6:07 PM on 10/29/14 a viable female was delivered via Vaginal, Spontaneous Delivery.  Bilateral 1st degree labial lacerations s/p repair.    Subjective: no complaints, up ad lib, voiding and tolerating PO, stable lochia, bottle feeding.  Contraception plan is depo provera. No headache, LE edema, visual changes.   Objective: Filed Vitals:   10/30/14 0928  BP: 147/86  Pulse:   Temp: 98.4 F (36.9 C)  Resp: 16    Physical Exam:  General: alert, cooperative and no distress. Chest: no inc work of breathing Heart: intact periph pulses Abdomen: mild tenderness, soft Uterine Fundus: firm DVT Evaluation: No evidence of DVT seen on physical exam. Extremities: no edema  Assessment/Plan: Plan for discharge tomorrow  Bottlefeeding  Henson,Amber 10:44 AM 10/30/2014  I have seen and examined this patient and I agree with the above. Cam HaiSHAW, KIMBERLY CNM 9:20 PM 11/03/2014

## 2014-10-30 NOTE — Progress Notes (Signed)
Clinical Social Work Department PSYCHOSOCIAL ASSESSMENT - MATERNAL/CHILD Nov 23, 2014  Patient:  Emily Frazier  Account Number:  [id number]  [place] Date:  02-28-2015  Ardine Eng Name:   Sung Amabile    Clinical Social Worker:  Desaray Marschner, LCSW   Date/Time:  06/09/15 09:45 AM  Date Referred:  2014-11-09   Referral source  Central Nursery     Referred reason  Hx of Substance Abuse   Other referral source:    I:  FAMILY / HOME ENVIRONMENT Child's legal guardian:  PARENT  Guardian - Name Dallas - Age [place] M [address] [place], [place] [address]  [place], [place] 21    Other household support members/support persons Other support:    II  PSYCHOSOCIAL DATA Information Source:    Occupational hygienist Employment:   Mother worked during pregnancy  FOB is employed   Museum/gallery curator resources:  [employer] [place]:   Other  [place] / Grade:   Maternity Care Coordinator / Child Services Coordination / Early Interventions:  Cultural issues impacting care:    III  STRENGTHS Strengths  Supportive family/friends  Home prepared for Child (including basic supplies)  Adequate Resources   Strength comment:    IV  RISK FACTORS AND CURRENT PROBLEMS Current Problem:       V  SOCIAL WORK ASSESSMENT Acknowledged order for social work consult to address concerns regarding mother's hx of substance abuse.  Met with mother who was pleasant and receptive to CSW intervention.  She is a single parent with no other dependents.  Informed that she is currently sharing an apartment with a friend.   FOB is reportedly employed and supportive.     Mother admits to occasional use of marijuana prior to finding out about the pregnancy.    She denies any other illicit drug use.      She denies any need for treatment, and states that she immediately stop using marijuana when she found out about the pregnancy.   UDS on  newborn was negative.  She denies any hx of mental illness. No acute social concerns related or noted at this time. Mother informed of social work Fish farm manager.      VI SOCIAL WORK PLAN Social Work Plan  No Barriers to Discharge   Type of pt/family education:   Hospital"s drug screen policy   If child protective services report - county:   If child protective services report - date:   Information/referral to community resources comment:   Other social work plan:   Will continue to monitor drug screen

## 2014-10-30 NOTE — Anesthesia Postprocedure Evaluation (Signed)
Anesthesia Post Note  Patient: Emily Frazier  Procedure(s) Performed: * No procedures listed *  Anesthesia type: Epidural  Patient location: Mother/Baby  Post pain: Pain level controlled  Post assessment: Post-op Vital signs reviewed  Last Vitals:  Filed Vitals:   10/30/14 0230  BP: 130/86  Pulse: 77  Temp: 37.2 C  Resp: 18    Post vital signs: Reviewed  Level of consciousness: awake  Complications: No apparent anesthesia complications

## 2014-10-31 NOTE — Discharge Instructions (Signed)

## 2014-10-31 NOTE — Discharge Summary (Signed)
Obstetric Discharge Summary Reason for Admission: induction of labor 2/2 preEclampsia with IUGR Prenatal Procedures: NST and ultrasound Intrapartum Procedures: spontaneous vaginal delivery Postpartum Procedures: none Complications-Operative and Postpartum: bilateral labial laceration HEMOGLOBIN  Date Value Ref Range Status  10/30/2014 9.2* 12.0 - 15.0 g/dL Final  96/04/540908/17/2015 81.113.1 g/dL Final   HCT  Date Value Ref Range Status  10/30/2014 26.6* 36.0 - 46.0 % Final  04/12/2014 38 % Final    Doing well today, pain well controlled, wants depo for contraception, bottle feeding.  No headache/visual changes/ RUQ pain/increased swelling  Delivery Note At 6:07 PM a viable female was delivered via Vaginal, Spontaneous Delivery (Presentation: LOA). APGAR: 7, 8; weight 4 lb 14 oz (2211 g).  Placenta status: Intact, Spontaneous. Cord: 3 vessels with the following complications: None.  Anesthesia: Epidural  Episiotomy: None Lacerations: Labial, bilateral  Suture Repair: Monocryl 3.0 Est. Blood Loss (mL): 150mL  Mom to postpartum. Baby to Couplet care / Skin to Skin. She is not on magnesium sulfate.    Physical Exam:  General: alert and cooperative Lochia: appropriate Uterine Fundus: firm DVT Evaluation: No evidence of DVT seen on physical exam. LE: minimal trace edema  Discharge Diagnoses: Term Pregnancy-delivered  Discharge Information: Date: 10/31/2014 Activity: pelvic rest Diet: routine Medications: None Condition: stable Instructions: refer to practice specific booklet Discharge to: home Follow-up Information    [facility] In 5 weeks.   Specialty:  Obstetrics and Gynecology   Contact information:   [address] [place] [id number] [phone]      Newborn Data: Live born female  Birth Weight: 4 lb 14 oz (2211 g) APGAR: 7, 8  Home with mother.  Emily Frazier 10/31/2014, 6:01 AM

## 2014-11-01 NOTE — Progress Notes (Signed)
NST reviewed and reactive.  Berlin Mokry L. Harraway-Smith, M.D., FACOG    

## 2014-11-02 NOTE — Progress Notes (Signed)
Post discharge chart review completed.  

## 2014-11-03 ENCOUNTER — Encounter: Payer: Medicaid Other | Admitting: Physician Assistant

## 2014-11-03 NOTE — Addendum Note (Signed)
Addendum  created 11/03/14 1942 by Karie SchwalbeMary Trashawn Oquendo, MD   Modules edited: Anesthesia Responsible Staff

## 2014-11-04 NOTE — Addendum Note (Signed)
Addendum  created 11/04/14 0936 by Mal AmabileMichael Emmanuell Kantz, MD   Modules edited: Anesthesia Responsible Staff

## 2014-11-14 ENCOUNTER — Encounter (HOSPITAL_COMMUNITY): Payer: Self-pay | Admitting: *Deleted

## 2014-11-14 ENCOUNTER — Emergency Department (HOSPITAL_COMMUNITY)
Admission: EM | Admit: 2014-11-14 | Discharge: 2014-11-14 | Disposition: A | Discharge status: Home / Self Care | Payer: Medicaid Other | Attending: Emergency Medicine | Admitting: Emergency Medicine

## 2014-11-14 DIAGNOSIS — S0591XA Unspecified injury of right eye and orbit, initial encounter: Secondary | ICD-10-CM | POA: Diagnosis present

## 2014-11-14 DIAGNOSIS — W500XXA Accidental hit or strike by another person, initial encounter: Secondary | ICD-10-CM | POA: Diagnosis not present

## 2014-11-14 DIAGNOSIS — Y999 Unspecified external cause status: Secondary | ICD-10-CM | POA: Insufficient documentation

## 2014-11-14 DIAGNOSIS — Y939 Activity, unspecified: Secondary | ICD-10-CM | POA: Diagnosis not present

## 2014-11-14 DIAGNOSIS — Y929 Unspecified place or not applicable: Secondary | ICD-10-CM | POA: Diagnosis not present

## 2014-11-14 DIAGNOSIS — S0501XA Injury of conjunctiva and corneal abrasion without foreign body, right eye, initial encounter: Secondary | ICD-10-CM | POA: Insufficient documentation

## 2014-11-14 MED ORDER — TETRACAINE HCL 0.5 % OP SOLN
2.0000 [drp] | Freq: Once | OPHTHALMIC | Status: AC
  Administered 2014-11-14: 2 [drp] via OPHTHALMIC

## 2014-11-14 MED ORDER — TRAMADOL HCL 50 MG PO TABS: 50.0000 mg | ORAL_TABLET | Freq: Four times a day (QID) | ORAL | Status: DC | PRN

## 2014-11-14 MED ORDER — FLUORESCEIN SODIUM 1 MG OP STRP
1.0000 | ORAL_STRIP | Freq: Once | OPHTHALMIC | Status: AC
  Administered 2014-11-14: 1 via OPHTHALMIC

## 2014-11-14 MED ORDER — GENTAMICIN SULFATE 0.3 % OP SOLN: 2.0000 [drp] | Freq: Four times a day (QID) | OPHTHALMIC | Status: DC

## 2014-11-14 NOTE — ED Notes (Signed)
The pt has pain with redness in her rt eye since yesterday when her son scratched her eye with his finger.

## 2014-11-14 NOTE — ED Provider Notes (Signed)
CSN: [id number]     Arrival date & time 11/14/14  0115 History  This chart was scribed for Geoffery Lyons, MD by Tanda Rockers, ED Scribe. This patient was seen in room D32C/D32C and the patient's care was started at 2:17 AM.     Chief Complaint  Patient presents with  . Eye Pain   Patient is a 19 y.o. female presenting with eye pain. The history is provided by the patient. No language interpreter was used.  Eye Pain This is a new problem. The current episode started yesterday. The problem occurs constantly. The problem has been gradually worsening. Nothing aggravates the symptoms.     HPI Comments: Emily Frazier is a 19 y.o. female who presents to the Emergency Department complaining of right eye pain that began 1 day ago. Pt reports her 28 week old son scratched her in the eye, causing the pain. She also complains of blurred vision to the right eye. She states that her eye began hurting immediately upon scratching it but that it has gradually worsened since.    Past Medical History  Diagnosis Date  . Medical history non-contributory    Past Surgical History  Procedure Laterality Date  . No past surgeries    . Wisdom tooth extraction     No family history on file. History  Substance Use Topics  . Smoking status: Never Smoker   . Smokeless tobacco: Never Used  . Alcohol Use: No   OB History    Gravida Para Term Preterm AB TAB SAB Ectopic Multiple Living   0 0 0 0 0 0 1     Review of Systems  Eyes: Positive for pain.    A complete 10 system review of systems was obtained and all systems are negative except as noted in the HPI and PMH.    Allergies  Review of patient's allergies indicates no known allergies.  Home Medications   Prior to Admission medications   Not on File   Triage Vitals: BP 150/98 mmHg  Pulse 90  Temp(Src) 98.1 F (36.7 C)  Resp 16  SpO2 98%   Physical Exam  Constitutional: She is oriented to person, place, and time. She appears  well-developed and well-nourished.  Non-toxic appearance. No distress.  HENT:  Head: Normocephalic and atraumatic.  Eyes: EOM and lids are normal. Pupils are equal, round, and reactive to light.  There is a small abrasion with fluorescein staining at approximately 4 o'clock to the cornea. There is slight purulent material expressed from the conjunctivae during exam.   Neck: Normal range of motion. Neck supple. No thyroid mass present.  Cardiovascular: Normal rate, regular rhythm and normal heart sounds.   Pulmonary/Chest: Effort normal and breath sounds normal. No respiratory distress. She has no decreased breath sounds. She has no rhonchi.  Abdominal: Soft. Normal appearance and bowel sounds are normal. There is no CVA tenderness.  Musculoskeletal: Normal range of motion. She exhibits no edema.  Neurological: She is alert and oriented to person, place, and time. She has normal strength. No sensory deficit. GCS eye subscore is 4. GCS verbal subscore is 5. GCS motor subscore is 6.  Skin: Skin is warm and dry. No abrasion and no rash noted.  Psychiatric: She has a normal mood and affect. Her speech is normal and behavior is normal.  Nursing note and vitals reviewed.   ED Course  Procedures (including critical care time)  DIAGNOSTIC STUDIES: Oxygen Saturation is 98% on RA, normal by  my interpretation.    COORDINATION OF CARE: 2:24 AM-Discussed treatment plan which includes Fluorescein eye stain test and prescription for antibiotic eye drops with pt at bedside and pt agreed to plan.   Labs Review Labs Reviewed - No data to display  Imaging Review No results found.   EKG Interpretation None      MDM   Final diagnoses:  None    Abrasion to the cornea with fluorescein staingin.  Also has a small amount of purulent drainage.  Will treat with antibiotic drops, pain meds, prn return.  I personally performed the services described in this documentation, which was scribed in my  presence. The recorded information has been reviewed and is accurate.       Geoffery Lyonsouglas Reyna Lorenzi, MD 11/14/14 1754

## 2014-11-14 NOTE — Discharge Instructions (Signed)
Gentamicin eyedrops every 4 hours while awake for the next several days.  Tramadol as prescribed as needed for pain.  Return to the emergency department if not improving in the next 2 days, sooner if your symptoms significantly worsen.   Corneal Abrasion The cornea is the clear covering at the front and center of the eye. When looking at the colored portion of the eye (iris), you are looking through the cornea. This very thin tissue is made up of many layers. The surface layer is a single layer of cells (corneal epithelium) and is one of the most sensitive tissues in the body. If a scratch or injury causes the corneal epithelium to come off, it is called a corneal abrasion. If the injury extends to the tissues below the epithelium, the condition is called a corneal ulcer. CAUSES   Scratches.  Trauma.  Foreign body in the eye. Some people have recurrences of abrasions in the area of the original injury even after it has healed (recurrent erosion syndrome). Recurrent erosion syndrome generally improves and goes away with time. SYMPTOMS   Eye pain.  Difficulty or inability to keep the injured eye open.  The eye becomes very sensitive to light.  Recurrent erosions tend to happen suddenly, first thing in the morning, usually after waking up and opening the eye. DIAGNOSIS  Your health care provider can diagnose a corneal abrasion during an eye exam. Dye is usually placed in the eye using a drop or a small paper strip moistened by your tears. When the eye is examined with a special light, the abrasion shows up clearly because of the dye. TREATMENT   Small abrasions may be treated with antibiotic drops or ointment alone.  A pressure patch may be put over the eye. If this is done, follow your doctor's instructions for when to remove the patch. Do not drive or use machines while the eye patch is on. Judging distances is hard to do with a patch on. If the abrasion becomes infected and spreads  to the deeper tissues of the cornea, a corneal ulcer can result. This is serious because it can cause corneal scarring. Corneal scars interfere with light passing through the cornea and cause a loss of vision in the involved eye. HOME CARE INSTRUCTIONS  Use medicine or ointment as directed. Only take over-the-counter or prescription medicines for pain, discomfort, or fever as directed by your health care provider.  Do not drive or operate machinery if your eye is patched. Your ability to judge distances is impaired.  If your health care provider has given you a follow-up appointment, it is very important to keep that appointment. Not keeping the appointment could result in a severe eye infection or permanent loss of vision. If there is any problem keeping the appointment, let your health care provider know. SEEK MEDICAL CARE IF:   You have pain, light sensitivity, and a scratchy feeling in one eye or both eyes.  Your pressure patch keeps loosening up, and you can blink your eye under the patch after treatment.  Any kind of discharge develops from the eye after treatment or if the lids stick together in the morning.  You have the same symptoms in the morning as you did with the original abrasion days, weeks, or months after the abrasion healed. MAKE SURE YOU:   Understand these instructions.  Will watch your condition.  Will get help right away if you are not doing well or get worse. Document Released: 08/10/2000 Document Revised:  08/18/2013 Document Reviewed: 04/20/2013 [facility] Patient Information 2015 [place], [place]. This information is not intended to replace advice given to you by your health care provider. Make sure you discuss any questions you have with your health care provider.

## 2014-11-14 NOTE — ED Notes (Signed)
Eye supplies at bedside 

## 2014-12-09 ENCOUNTER — Ambulatory Visit (INDEPENDENT_AMBULATORY_CARE_PROVIDER_SITE_OTHER): Payer: Medicaid Other | Admitting: Advanced Practice Midwife

## 2014-12-09 ENCOUNTER — Encounter: Payer: Self-pay | Admitting: Advanced Practice Midwife

## 2014-12-09 VITALS — BP 105/65 | HR 77 | Temp 97.7°F | Ht 65.0 in | Wt 133.1 lb

## 2014-12-09 DIAGNOSIS — Z01812 Encounter for preprocedural laboratory examination: Secondary | ICD-10-CM | POA: Diagnosis not present

## 2014-12-09 DIAGNOSIS — Z3202 Encounter for pregnancy test, result negative: Secondary | ICD-10-CM

## 2014-12-09 DIAGNOSIS — Z3042 Encounter for surveillance of injectable contraceptive: Secondary | ICD-10-CM | POA: Insufficient documentation

## 2014-12-09 DIAGNOSIS — IMO0001 Reserved for inherently not codable concepts without codable children: Secondary | ICD-10-CM | POA: Insufficient documentation

## 2014-12-09 DIAGNOSIS — Z8742 Personal history of other diseases of the female genital tract: Secondary | ICD-10-CM

## 2014-12-09 DIAGNOSIS — O1494 Unspecified pre-eclampsia, complicating childbirth: Secondary | ICD-10-CM

## 2014-12-09 LAB — POCT PREGNANCY, URINE: Preg Test, Ur: NEGATIVE

## 2014-12-09 MED ORDER — MEDROXYPROGESTERONE ACETATE 104 MG/0.65ML ~~LOC~~ SUSP
104.0000 mg | Freq: Once | SUBCUTANEOUS | Status: AC
  Administered 2014-12-09: 104 mg via SUBCUTANEOUS

## 2014-12-09 MED ORDER — MEDROXYPROGESTERONE ACETATE 150 MG/ML IM SUSP: 150.0000 mg | INTRAMUSCULAR | Status: DC

## 2014-12-09 NOTE — Patient Instructions (Signed)

## 2014-12-09 NOTE — Progress Notes (Signed)
  Subjective:     Emily Frazier is a 19 y.o. female who presents for a postpartum visit. She is 6 weeks postpartum following a spontaneous vaginal delivery. I have fully reviewed the prenatal and intrapartum course. The delivery was at term. Outcome: spontaneous vaginal delivery. Anesthesia: regional. Postpartum course has been uneventful.  She had preeclampsia prior to birth, but has had no symptoms of that since delivery. Baby's course has been uneventful. Baby is feeding by bottle. Bleeding staining only. Bowel function is normal. Bladder function is normal. Patient is sexually active. Contraception method is Depo-Provera injections. Postpartum depression screening: negative.  The following portions of the patient's history were reviewed and updated as appropriate: allergies, current medications, past family history, past medical history, past social history, past surgical history and problem list.  Review of Systems Pertinent items are noted in HPI.   Objective:    BP 105/65 mmHg  Pulse 77  Temp(Src) 97.7 F (36.5 C) (Oral)  Ht 5\' 5"  (1.651 m)  Wt 133 lb 1.6 oz (60.374 kg)  BMI 22.15 kg/m2  Breastfeeding? No  General:  alert, cooperative and no distress   Breasts:  inspection negative, no nipple discharge or bleeding, no masses or nodularity palpable  Lungs: clear to auscultation bilaterally  Heart:  regular rate and rhythm, S1, S2 normal, no murmur, click, rub or gallop  Abdomen: soft, non-tender; bowel sounds normal; no masses,  no organomegaly   Vulva:  normal  Vagina: not evaluated  Cervix:  n/a  Corpus: not examined  Adnexa:  not evaluated  Rectal Exam: Not performed.        Assessment:     Normal postpartum exam. Pap smear not done at today's visit.   Plan:    1. Contraception: Depo-Provera injections 2. Discussed with Dr Erin FullingHarraway-Smith who advises she can have Depo Injection today, despite two instances of unprotected IC  3. Follow up in: 3 months or as needed.

## 2015-03-02 ENCOUNTER — Ambulatory Visit: Payer: Medicaid Other

## 2015-03-10 ENCOUNTER — Ambulatory Visit: Payer: Medicaid Other

## 2015-03-17 ENCOUNTER — Ambulatory Visit: Payer: Self-pay

## 2015-03-17 NOTE — Progress Notes (Signed)
Pt came today for Depo Provera.  Pt currently does not have insurance.  I advised pt to go to [facility] so that she does not have to pay out of pocket for her depo provera.  Pt agreed.  Scheduled pt an appt @ [facility] July 27th @ 1000.  Pt given contact # and appt information.  Pt stated "thank you" with no further questions.

## 2015-03-28 ENCOUNTER — Emergency Department (HOSPITAL_COMMUNITY)
Admission: EM | Admit: 2015-03-28 | Discharge: 2015-03-28 | Disposition: A | Discharge status: Home / Self Care | Payer: Medicaid Other | Attending: Emergency Medicine | Admitting: Emergency Medicine

## 2015-03-28 ENCOUNTER — Encounter (HOSPITAL_COMMUNITY): Payer: Self-pay | Admitting: *Deleted

## 2015-03-28 DIAGNOSIS — R110 Nausea: Secondary | ICD-10-CM | POA: Insufficient documentation

## 2015-03-28 DIAGNOSIS — Z792 Long term (current) use of antibiotics: Secondary | ICD-10-CM | POA: Diagnosis not present

## 2015-03-28 DIAGNOSIS — R197 Diarrhea, unspecified: Secondary | ICD-10-CM | POA: Insufficient documentation

## 2015-03-28 DIAGNOSIS — T490X1A Poisoning by local antifungal, anti-infective and anti-inflammatory drugs, accidental (unintentional), initial encounter: Secondary | ICD-10-CM | POA: Diagnosis not present

## 2015-03-28 DIAGNOSIS — R109 Unspecified abdominal pain: Secondary | ICD-10-CM | POA: Diagnosis present

## 2015-03-28 DIAGNOSIS — T50905A Adverse effect of unspecified drugs, medicaments and biological substances, initial encounter: Secondary | ICD-10-CM

## 2015-03-28 MED ORDER — ONDANSETRON 4 MG PO TBDP: 4.0000 mg | ORAL_TABLET | Freq: Three times a day (TID) | ORAL | Status: DC | PRN

## 2015-03-28 NOTE — ED Notes (Signed)
Declined W/C at D/C and was escorted to lobby by RN. 

## 2015-03-28 NOTE — ED Notes (Signed)
Pt is here with lower abdominal pain and continued feeling that she needs to poop but nothing comes out.  Pt reports some diarrhea.  Pt reports nausea.  Pt states that she used flagyl insert gel and she drank alcohol last nite and then she got sick.

## 2015-03-28 NOTE — ED Provider Notes (Signed)
CSN: [id number]     Arrival date & time 03/28/15  1319 History  This chart was scribed for Eber Hong, MD by Budd Palmer, ED Scribe. This patient was seen in room TR09C/TR09C and the patient's care was started at 2:21 PM.    Chief Complaint  Patient presents with  . Abdominal Pain   The history is provided by the patient. No language interpreter was used.   HPI Comments: Emily Frazier is a 19 y.o. female who presents to the Emergency Department complaining of abdominal pain onset this morning. Pt is taking Metro gel for BV. Yesterday evening was her second time taking the gel and she drank 4 shots that night. She woke up this morning with intermittent abdominal cramping, nausea, and diarrhea. She notes that the symptoms have now resolved. She has tested negative for pregnancy within the past few days. Pt denies dysuria.   Past Medical History  Diagnosis Date  . Medical history non-contributory    Past Surgical History  Procedure Laterality Date  . No past surgeries    . Wisdom tooth extraction     No family history on file. History  Substance Use Topics  . Smoking status: Never Smoker   . Smokeless tobacco: Never Used  . Alcohol Use: No   OB History    Gravida Para Term Preterm AB TAB SAB Ectopic Multiple Living   1 1 1  0 0 0 0 0 0 1     Review of Systems  Constitutional: Negative for fever.  Gastrointestinal: Positive for nausea, abdominal pain and diarrhea.      Allergies  Review of patient's allergies indicates no known allergies.  Home Medications   Prior to Admission medications   Medication Sig Start Date End Date Taking? Authorizing Provider  gentamicin (GARAMYCIN) 0.3 % ophthalmic solution Place 2 drops into the right eye 4 (four) times daily. 11/14/14   Geoffery Lyons, MD  ondansetron (ZOFRAN ODT) 4 MG disintegrating tablet Take 1 tablet (4 mg total) by mouth every 8 (eight) hours as needed for nausea. 03/28/15   Eber Hong, MD  traMADol (ULTRAM) 50 MG  tablet Take 1 tablet (50 mg total) by mouth every 6 (six) hours as needed. 11/14/14   Geoffery Lyons, MD   BP 112/58 mmHg  Pulse 63  Temp(Src) 98.1 F (36.7 C) (Oral)  Resp 16  Ht 5\' 7"  (1.702 m)  Wt 118 lb (53.524 kg)  BMI 18.48 kg/m2  SpO2 100%  LMP 03/21/2015 Physical Exam  Constitutional: She appears well-developed and well-nourished.  HENT:  Head: Normocephalic and atraumatic.  Eyes: Conjunctivae are normal. Right eye exhibits no discharge. Left eye exhibits no discharge.  Pulmonary/Chest: Effort normal. No respiratory distress.  Abdominal: Soft. There is no tenderness. There is no guarding.  Neurological: She is alert. Coordination normal.  Skin: Skin is warm and dry. No rash noted. She is not diaphoretic. No erythema.  Psychiatric: She has a normal mood and affect.  Nursing note and vitals reviewed.   ED Course  Procedures  DIAGNOSTIC STUDIES: Oxygen Saturation is 9% on RA, normal by my interpretation.    COORDINATION OF CARE: 2:24 PM - Discussed plans to order anti-nausea medication. Advised to take ibuprofen for pain. Pt advised of plan for treatment and pt agrees.  Labs Review Labs Reviewed - No data to display  Imaging Review No results found.    MDM   Final diagnoses:  Medication reaction, initial encounter    Well appearing, no signs of surgical  abdomen, normal VS, well appearing, sx have waned and are now gone.  likelyi med recation with the flagyl and alcohol - stable for d/c.   I personally performed the services described in this documentation, which was scribed in my presence. The recorded information has been reviewed and is accurate.    Meds given in ED:  Medications - No data to display  Discharge Medication List as of 03/28/2015  2:28 PM    START taking these medications   Details  ondansetron (ZOFRAN ODT) 4 MG disintegrating tablet Take 1 tablet (4 mg total) by mouth every 8 (eight) hours as needed for nausea., Starting 03/28/2015, Until  Discontinued, Print           Eber Hong, MD 03/28/15 [phone]

## 2015-03-28 NOTE — Discharge Instructions (Signed)
The reaction you had is expected when you take alcohol with that drug - please don't use alcohol while taking this medicine.  You may use zofran for nausea  Ibuprofen for pain  Please obtain all of your results from medical records or have your doctors office obtain the results - share them with your doctor - you should be seen at your doctors office in the next 2 days. Call today to arrange your follow up. Take the medications as prescribed. Please review all of the medicines and only take them if you do not have an allergy to them. Please be aware that if you are taking birth control pills, taking other prescriptions, ESPECIALLY ANTIBIOTICS may make the birth control ineffective - if this is the case, either do not engage in sexual activity or use alternative methods of birth control such as condoms until you have finished the medicine and your family doctor says it is OK to restart them. If you are on a blood thinner such as COUMADIN, be aware that any other medicine that you take may cause the coumadin to either work too much, or not enough - you should have your coumadin level rechecked in next 7 days if this is the case.  ?  It is also a possibility that you have an allergic reaction to any of the medicines that you have been prescribed - Everybody reacts differently to medications and while MOST people have no trouble with most medicines, you may have a reaction such as nausea, vomiting, rash, swelling, shortness of breath. If this is the case, please stop taking the medicine immediately and contact your physician.  ?  You should return to the ER if you develop severe or worsening symptoms.

## 2016-01-11 ENCOUNTER — Encounter (HOSPITAL_COMMUNITY): Payer: Self-pay | Admitting: Emergency Medicine

## 2016-01-11 ENCOUNTER — Emergency Department (HOSPITAL_COMMUNITY)
Admission: EM | Admit: 2016-01-11 | Discharge: 2016-01-11 | Disposition: A | Discharge status: Home / Self Care | Payer: No Typology Code available for payment source | Attending: Emergency Medicine | Admitting: Emergency Medicine

## 2016-01-11 DIAGNOSIS — Y998 Other external cause status: Secondary | ICD-10-CM | POA: Insufficient documentation

## 2016-01-11 DIAGNOSIS — S3992XA Unspecified injury of lower back, initial encounter: Secondary | ICD-10-CM | POA: Insufficient documentation

## 2016-01-11 DIAGNOSIS — Y92410 Unspecified street and highway as the place of occurrence of the external cause: Secondary | ICD-10-CM | POA: Insufficient documentation

## 2016-01-11 DIAGNOSIS — S161XXA Strain of muscle, fascia and tendon at neck level, initial encounter: Secondary | ICD-10-CM | POA: Insufficient documentation

## 2016-01-11 DIAGNOSIS — S29002A Unspecified injury of muscle and tendon of back wall of thorax, initial encounter: Secondary | ICD-10-CM | POA: Insufficient documentation

## 2016-01-11 DIAGNOSIS — S199XXA Unspecified injury of neck, initial encounter: Secondary | ICD-10-CM | POA: Diagnosis present

## 2016-01-11 DIAGNOSIS — S4992XA Unspecified injury of left shoulder and upper arm, initial encounter: Secondary | ICD-10-CM | POA: Insufficient documentation

## 2016-01-11 DIAGNOSIS — S0990XA Unspecified injury of head, initial encounter: Secondary | ICD-10-CM | POA: Insufficient documentation

## 2016-01-11 DIAGNOSIS — Z792 Long term (current) use of antibiotics: Secondary | ICD-10-CM | POA: Insufficient documentation

## 2016-01-11 DIAGNOSIS — F17200 Nicotine dependence, unspecified, uncomplicated: Secondary | ICD-10-CM | POA: Diagnosis not present

## 2016-01-11 DIAGNOSIS — Y9389 Activity, other specified: Secondary | ICD-10-CM | POA: Insufficient documentation

## 2016-01-11 DIAGNOSIS — S4991XA Unspecified injury of right shoulder and upper arm, initial encounter: Secondary | ICD-10-CM | POA: Diagnosis not present

## 2016-01-11 MED ORDER — IBUPROFEN 800 MG PO TABS
800.0000 mg | ORAL_TABLET | Freq: Three times a day (TID) | ORAL | Status: DC
Start: 2016-01-11 — End: 2017-02-05

## 2016-01-11 MED ORDER — IBUPROFEN 400 MG PO TABS
800.0000 mg | ORAL_TABLET | Freq: Once | ORAL | Status: AC
  Administered 2016-01-11: 800 mg via ORAL
  Filled 2016-01-11: qty 2

## 2016-01-11 MED ORDER — METHOCARBAMOL 500 MG PO TABS: 500.0000 mg | ORAL_TABLET | Freq: Two times a day (BID) | ORAL | Status: DC

## 2016-01-11 NOTE — Discharge Instructions (Signed)
Cervical Strain and Sprain With Rehab °Cervical strain and sprain are injuries that commonly occur with "whiplash" injuries. Whiplash occurs when the neck is forcefully whipped backward or forward, such as during a motor vehicle accident or during contact sports. The muscles, ligaments, tendons, discs, and nerves of the neck are susceptible to injury when this occurs. °RISK FACTORS °Risk of having a whiplash injury increases if: °· Osteoarthritis of the spine. °· Situations that make head or neck accidents or trauma more likely. °· High-risk sports (football, rugby, wrestling, hockey, auto racing, gymnastics, diving, contact karate, or boxing). °· Poor strength and flexibility of the neck. °· Previous neck injury. °· Poor tackling technique. °· Improperly fitted or padded equipment. °SYMPTOMS  °· Pain or stiffness in the front or back of neck or both. °· Symptoms may present immediately or up to 24 hours after injury. °· Dizziness, headache, nausea, and vomiting. °· Muscle spasm with soreness and stiffness in the neck. °· Tenderness and swelling at the injury site. °PREVENTION °· Learn and use proper technique (avoid tackling with the head, spearing, and head-butting; use proper falling techniques to avoid landing on the head). °· Warm up and stretch properly before activity. °· Maintain physical fitness: °· Strength, flexibility, and endurance. °· Cardiovascular fitness. °· Wear properly fitted and padded protective equipment, such as padded soft collars, for participation in contact sports. °PROGNOSIS  °Recovery from cervical strain and sprain injuries is dependent on the extent of the injury. These injuries are usually curable in 1 week to 3 months with appropriate treatment.  °RELATED COMPLICATIONS  °· Temporary numbness and weakness may occur if the nerve roots are damaged, and this may persist until the nerve has completely healed. °· Chronic pain due to frequent recurrence of symptoms. °· Prolonged healing,  especially if activity is resumed too soon (before complete recovery). °TREATMENT  °Treatment initially involves the use of ice and medication to help reduce pain and inflammation. It is also important to perform strengthening and stretching exercises and modify activities that worsen symptoms so the injury does not get worse. These exercises may be performed at home or with a therapist. For patients who experience severe symptoms, a soft, padded collar may be recommended to be worn around the neck.  °Improving your posture may help reduce symptoms. Posture improvement includes pulling your chin and abdomen in while sitting or standing. If you are sitting, sit in a firm chair with your buttocks against the back of the chair. While sleeping, try replacing your pillow with a small towel rolled to 2 inches in diameter, or use a cervical pillow or soft cervical collar. Poor sleeping positions delay healing.  °For patients with nerve root damage, which causes numbness or weakness, the use of a cervical traction apparatus may be recommended. Surgery is rarely necessary for these injuries. However, cervical strain and sprains that are present at birth (congenital) may require surgery. °MEDICATION  °· If pain medication is necessary, nonsteroidal anti-inflammatory medications, such as aspirin and ibuprofen, or other minor pain relievers, such as acetaminophen, are often recommended. °· Do not take pain medication for 7 days before surgery. °· Prescription pain relievers may be given if deemed necessary by your caregiver. Use only as directed and only as much as you need. °HEAT AND COLD:  °· Cold treatment (icing) relieves pain and reduces inflammation. Cold treatment should be applied for 10 to 15 minutes every 2 to 3 hours for inflammation and pain and immediately after any activity that aggravates your   symptoms. Use ice packs or an ice massage. °· Heat treatment may be used prior to performing the stretching and  strengthening activities prescribed by your caregiver, physical therapist, or athletic trainer. Use a heat pack or a warm soak. °SEEK MEDICAL CARE IF:  °· Symptoms get worse or do not improve in 2 weeks despite treatment. °· New, unexplained symptoms develop (drugs used in treatment may produce side effects). °EXERCISES °RANGE OF MOTION (ROM) AND STRETCHING EXERCISES - Cervical Strain and Sprain °These exercises may help you when beginning to rehabilitate your injury. In order to successfully resolve your symptoms, you must improve your posture. These exercises are designed to help reduce the forward-head and rounded-shoulder posture which contributes to this condition. Your symptoms may resolve with or without further involvement from your physician, physical therapist or athletic trainer. While completing these exercises, remember:  °· Restoring tissue flexibility helps normal motion to return to the joints. This allows healthier, less painful movement and activity. °· An effective stretch should be held for at least 20 seconds, although you may need to begin with shorter hold times for comfort. °· A stretch should never be painful. You should only feel a gentle lengthening or release in the stretched tissue. °STRETCH- Axial Extensors °· Lie on your back on the floor. You may bend your knees for comfort. Place a rolled-up hand towel or dish towel, about 2 inches in diameter, under the part of your head that makes contact with the floor. °· Gently tuck your chin, as if trying to make a "double chin," until you feel a gentle stretch at the base of your head. °· Hold __________ seconds. °Repeat __________ times. Complete this exercise __________ times per day.  °STRETCH - Axial Extension  °· Stand or sit on a firm surface. Assume a good posture: chest up, shoulders drawn back, abdominal muscles slightly tense, knees unlocked (if standing) and feet hip width apart. °· Slowly retract your chin so your head slides back  and your chin slightly lowers. Continue to look straight ahead. °· You should feel a gentle stretch in the back of your head. Be certain not to feel an aggressive stretch since this can cause headaches later. °· Hold for __________ seconds. °Repeat __________ times. Complete this exercise __________ times per day. °STRETCH - Cervical Side Bend  °· Stand or sit on a firm surface. Assume a good posture: chest up, shoulders drawn back, abdominal muscles slightly tense, knees unlocked (if standing) and feet hip width apart. °· Without letting your nose or shoulders move, slowly tip your right / left ear to your shoulder until your feel a gentle stretch in the muscles on the opposite side of your neck. °· Hold __________ seconds. °Repeat __________ times. Complete this exercise __________ times per day. °STRETCH - Cervical Rotators  °· Stand or sit on a firm surface. Assume a good posture: chest up, shoulders drawn back, abdominal muscles slightly tense, knees unlocked (if standing) and feet hip width apart. °· Keeping your eyes level with the ground, slowly turn your head until you feel a gentle stretch along the back and opposite side of your neck. °· Hold __________ seconds. °Repeat __________ times. Complete this exercise __________ times per day. °RANGE OF MOTION - Neck Circles  °· Stand or sit on a firm surface. Assume a good posture: chest up, shoulders drawn back, abdominal muscles slightly tense, knees unlocked (if standing) and feet hip width apart. °· Gently roll your head down and around from the back   of one shoulder to the back of the other. The motion should never be forced or painful. °· Repeat the motion 10-20 times, or until you feel the neck muscles relax and loosen. °Repeat __________ times. Complete the exercise __________ times per day. °STRENGTHENING EXERCISES - Cervical Strain and Sprain °These exercises may help you when beginning to rehabilitate your injury. They may resolve your symptoms with or  without further involvement from your physician, physical therapist, or athletic trainer. While completing these exercises, remember:  °· Muscles can gain both the endurance and the strength needed for everyday activities through controlled exercises. °· Complete these exercises as instructed by your physician, physical therapist, or athletic trainer. Progress the resistance and repetitions only as guided. °· You may experience muscle soreness or fatigue, but the pain or discomfort you are trying to eliminate should never worsen during these exercises. If this pain does worsen, stop and make certain you are following the directions exactly. If the pain is still present after adjustments, discontinue the exercise until you can discuss the trouble with your clinician. °STRENGTH - Cervical Flexors, Isometric °· Face a wall, standing about 6 inches away. Place a small pillow, a ball about 6-8 inches in diameter, or a folded towel between your forehead and the wall. °· Slightly tuck your chin and gently push your forehead into the soft object. Push only with mild to moderate intensity, building up tension gradually. Keep your jaw and forehead relaxed. °· Hold 10 to 20 seconds. Keep your breathing relaxed. °· Release the tension slowly. Relax your neck muscles completely before you start the next repetition. °Repeat __________ times. Complete this exercise __________ times per day. °STRENGTH- Cervical Lateral Flexors, Isometric  °· Stand about 6 inches away from a wall. Place a small pillow, a ball about 6-8 inches in diameter, or a folded towel between the side of your head and the wall. °· Slightly tuck your chin and gently tilt your head into the soft object. Push only with mild to moderate intensity, building up tension gradually. Keep your jaw and forehead relaxed. °· Hold 10 to 20 seconds. Keep your breathing relaxed. °· Release the tension slowly. Relax your neck muscles completely before you start the next  repetition. °Repeat __________ times. Complete this exercise __________ times per day. °STRENGTH - Cervical Extensors, Isometric  °· Stand about 6 inches away from a wall. Place a small pillow, a ball about 6-8 inches in diameter, or a folded towel between the back of your head and the wall. °· Slightly tuck your chin and gently tilt your head back into the soft object. Push only with mild to moderate intensity, building up tension gradually. Keep your jaw and forehead relaxed. °· Hold 10 to 20 seconds. Keep your breathing relaxed. °· Release the tension slowly. Relax your neck muscles completely before you start the next repetition. °Repeat __________ times. Complete this exercise __________ times per day. °POSTURE AND BODY MECHANICS CONSIDERATIONS - Cervical Strain and Sprain °Keeping correct posture when sitting, standing or completing your activities will reduce the stress put on different body tissues, allowing injured tissues a chance to heal and limiting painful experiences. The following are general guidelines for improved posture. Your physician or physical therapist will provide you with any instructions specific to your needs. While reading these guidelines, remember: °· The exercises prescribed by your provider will help you have the flexibility and strength to maintain correct postures. °· The correct posture provides the optimal environment for your joints to work.   All of your joints have less wear and tear when properly supported by a spine with good posture. This means you will experience a healthier, less painful body. °· Correct posture must be practiced with all of your activities, especially prolonged sitting and standing. Correct posture is as important when doing repetitive low-stress activities (typing) as it is when doing a single heavy-load activity (lifting). °PROLONGED STANDING WHILE SLIGHTLY LEANING FORWARD °When completing a task that requires you to lean forward while standing in one  place for a long time, place either foot up on a stationary 2- to 4-inch high object to help maintain the best posture. When both feet are on the ground, the low back tends to lose its slight inward curve. If this curve flattens (or becomes too large), then the back and your other joints will experience too much stress, fatigue more quickly, and can cause pain.  °RESTING POSITIONS °Consider which positions are most painful for you when choosing a resting position. If you have pain with flexion-based activities (sitting, bending, stooping, squatting), choose a position that allows you to rest in a less flexed posture. You would want to avoid curling into a fetal position on your side. If your pain worsens with extension-based activities (prolonged standing, working overhead), avoid resting in an extended position such as sleeping on your stomach. Most people will find more comfort when they rest with their spine in a more neutral position, neither too rounded nor too arched. Lying on a non-sagging bed on your side with a pillow between your knees, or on your back with a pillow under your knees will often provide some relief. Keep in mind, being in any one position for a prolonged period of time, no matter how correct your posture, can still lead to stiffness. °WALKING °Walk with an upright posture. Your ears, shoulders, and hips should all line up. °OFFICE WORK °When working at a desk, create an environment that supports good, upright posture. Without extra support, muscles fatigue and lead to excessive strain on joints and other tissues. °CHAIR: °· A chair should be able to slide under your desk when your back makes contact with the back of the chair. This allows you to work closely. °· The chair's height should allow your eyes to be level with the upper part of your monitor and your hands to be slightly lower than your elbows. °· Body position: °¨ Your feet should make contact with the floor. If this is not  possible, use a foot rest. °¨ Keep your ears over your shoulders. This will reduce stress on your neck and low back. °  °This information is not intended to replace advice given to you by your health care provider. Make sure you discuss any questions you have with your health care provider. °  °Document Released: 08/13/2005 Document Revised: 09/03/2014 Document Reviewed: 11/25/2008 °[employer]vier Interactive Patient Education ©2016 [employer] ° °Motor Vehicle Collision °After a car crash (motor vehicle collision), it is normal to have bruises and sore muscles. The first 24 hours usually feel the worst. After that, you will likely start to feel better each day. °HOME CARE °· Put ice on the injured area. °¨ Put ice in a plastic bag. °¨ Place a towel between your skin and the bag. °¨ Leave the ice on for 15-20 minutes, 03-04 times a day. °· Drink enough fluids to keep your pee (urine) clear or pale yellow. °· Do not drink alcohol. °· Take a warm shower or bath 1 or   2 times a day. This helps your sore muscles. °· Return to activities as told by your doctor. Be careful when lifting. Lifting can make neck or back pain worse. °· Only take medicine as told by your doctor. Do not use aspirin. °GET HELP RIGHT AWAY IF:  °· Your arms or legs tingle, feel weak, or lose feeling (numbness). °· You have headaches that do not get better with medicine. °· You have neck pain, especially in the middle of the back of your neck. °· You cannot control when you pee (urinate) or poop (bowel movement). °· Pain is getting worse in any part of your body. °· You are short of breath, dizzy, or pass out (faint). °· You have chest pain. °· You feel sick to your stomach (nauseous), throw up (vomit), or sweat. °· You have belly (abdominal) pain that gets worse. °· There is blood in your pee, poop, or throw up. °· You have pain in your shoulder (shoulder strap areas). °· Your problems are getting worse. °MAKE SURE YOU:  °· Understand these  instructions. °· Will watch your condition. °· Will get help right away if you are not doing well or get worse. °  °This information is not intended to replace advice given to you by your health care provider. Make sure you discuss any questions you have with your health care provider. °  °Document Released: 01/30/2008 Document Revised: 11/05/2011 Document Reviewed: 01/10/2011 °Elsevier Interactive Patient Education ©2016 [employer] ° °
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
## 2016-07-30 IMAGING — US US OB COMP +14 WK
2 series · 12 of 28 positions shown · non-contrast
Comparison: none

[Series 1: us ob comp +14 wk mfm · 10 of 108 slices shown (1 of 2)]
[im 5/108]
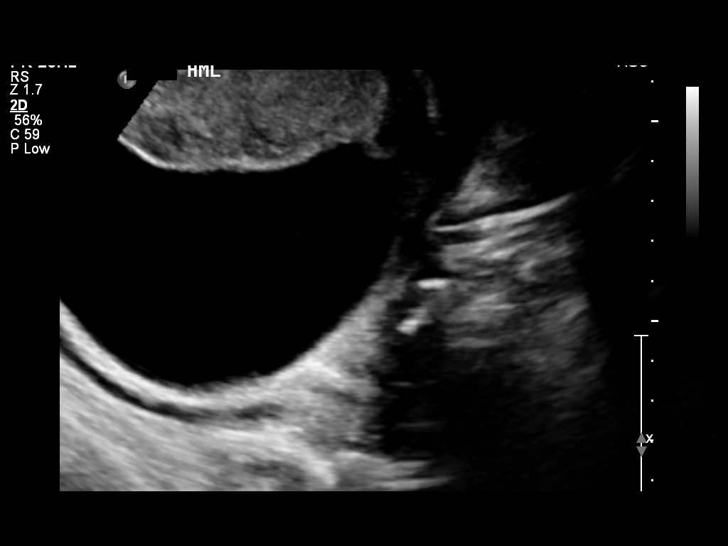
[im 15/108]
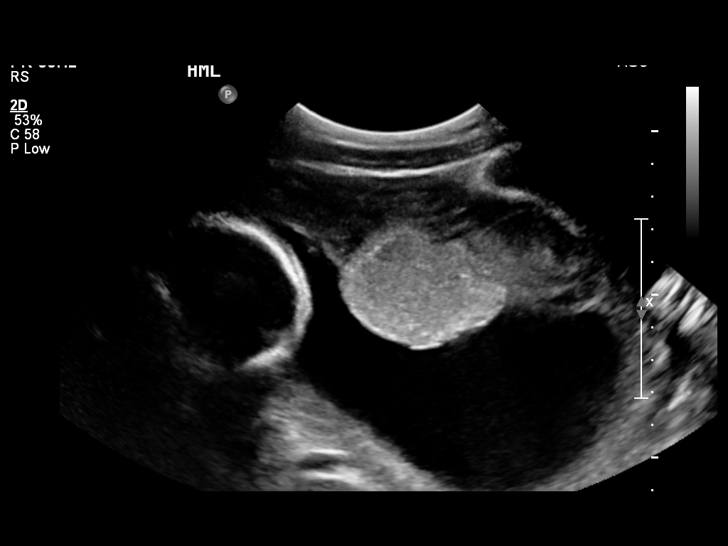
[im 25/108]
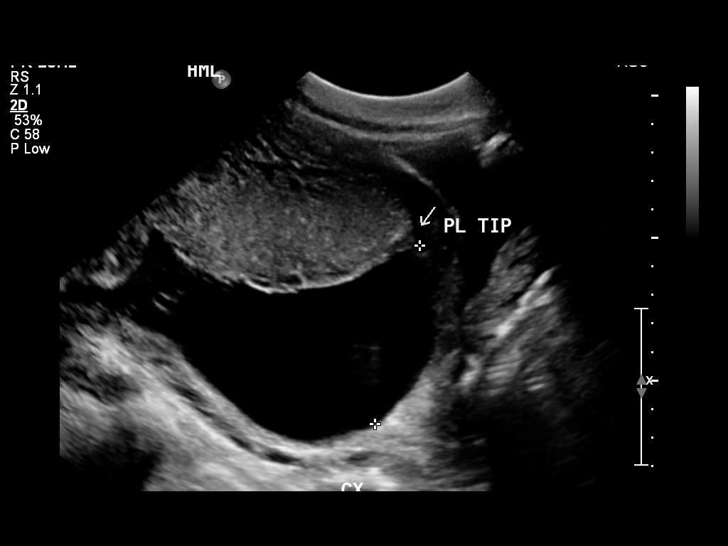
[im 39/108]
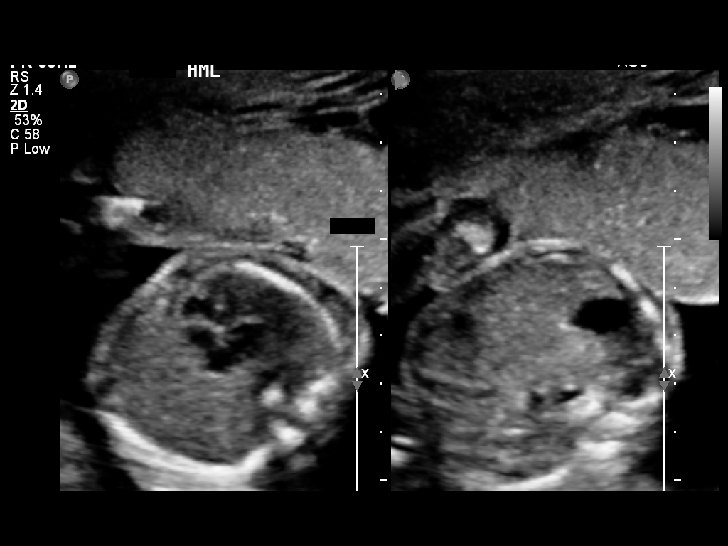
[im 49/108]
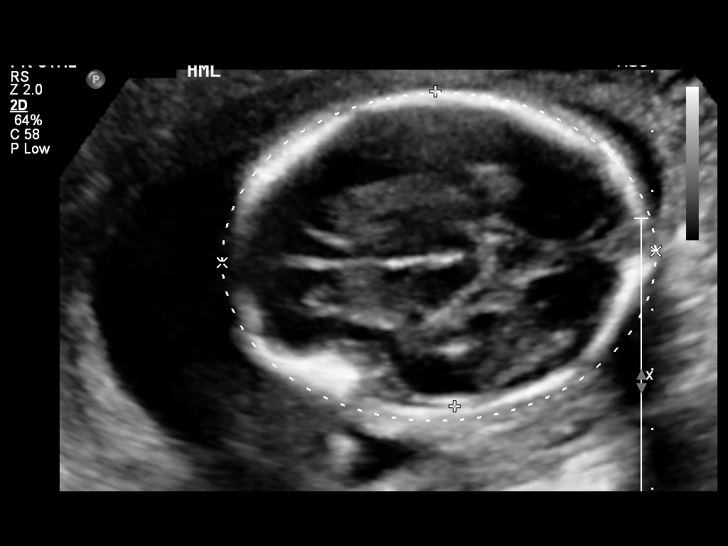
[im 59/108]
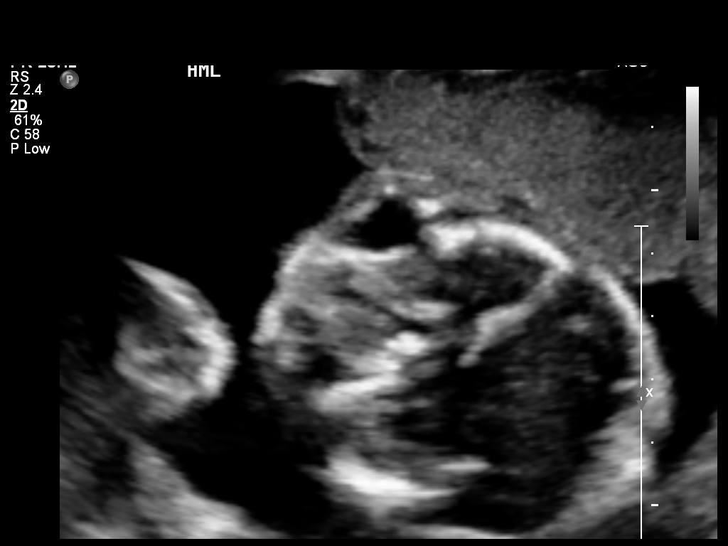
[im 73/108]
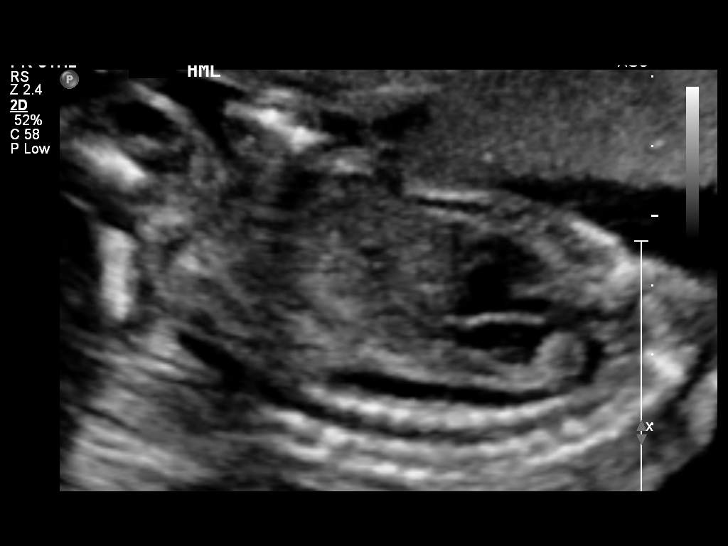
[im 83/108]
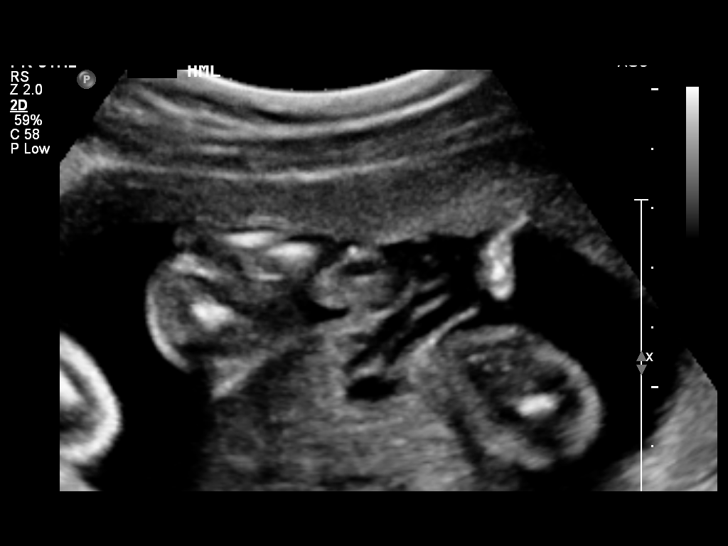
[im 93/108]
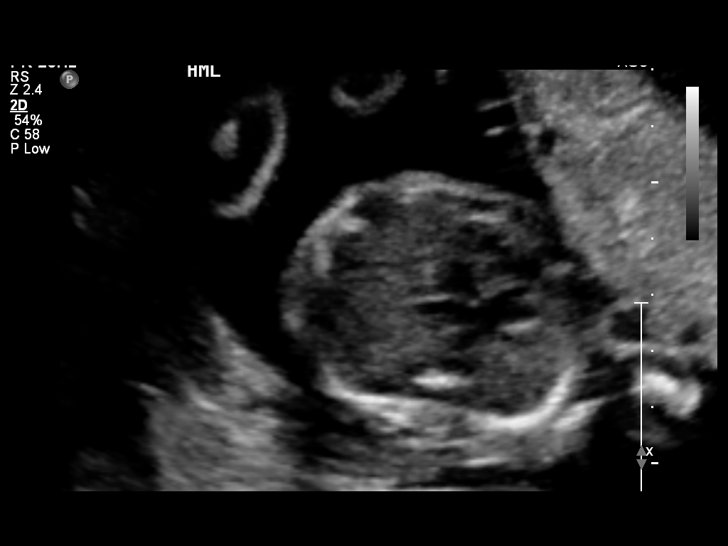
[im 108/108]
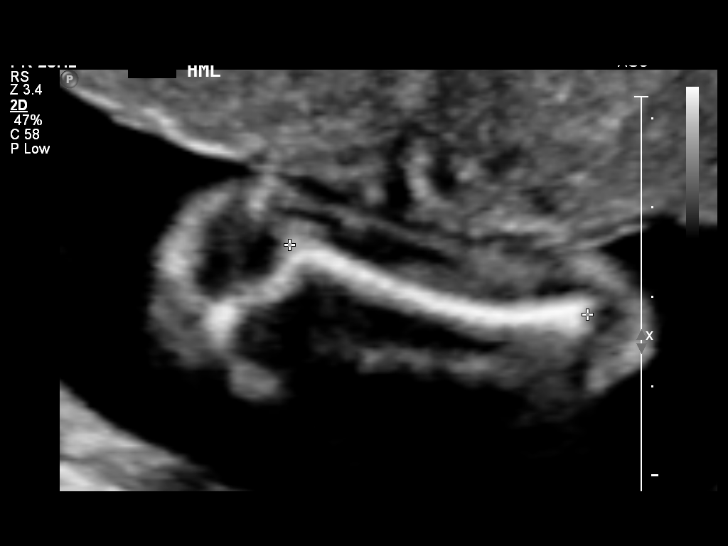

[Series 1: us ob comp +14 wk mfm · 2 of 21 slices shown (2 of 2)]
[im 6/21]
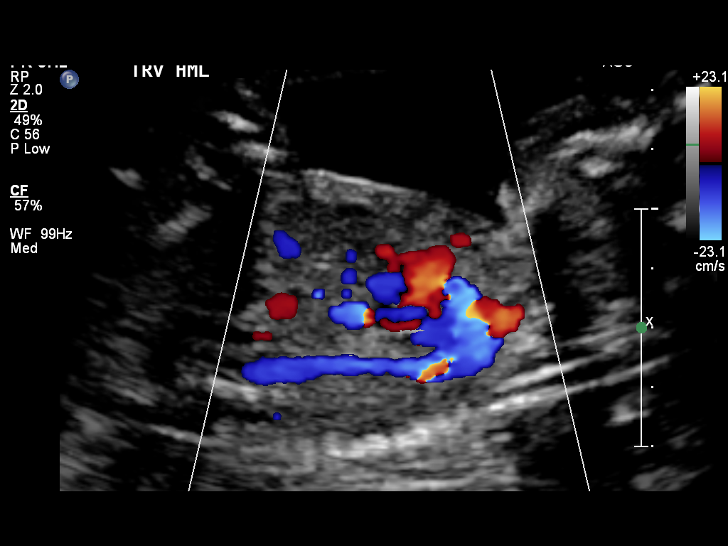
[im 16/21]
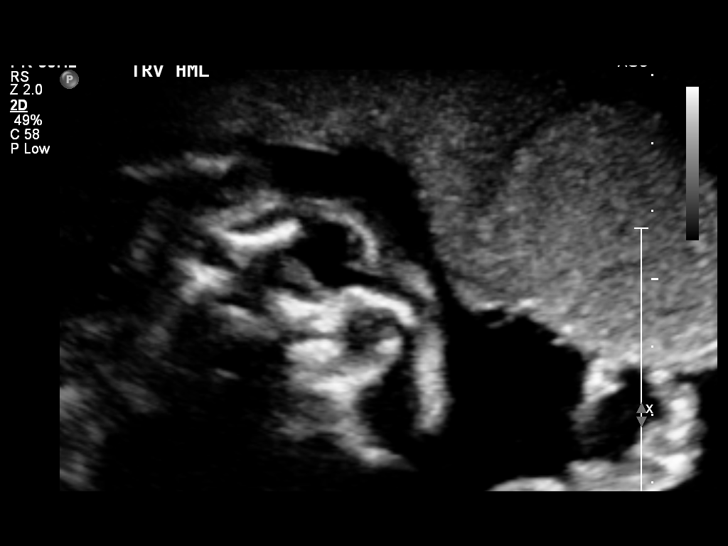

[12 of 28 positions shown; findings below may reference images not displayed]

OBSTETRICS REPORT
                      (Signed Final 07/16/2014 [DATE])

Service(s) Provided

 US OB COMP + 14 WK                                    76805.1
Indications

 Basic anatomic survey                                 [id number]
 22 weeks gestation of pregnancy
Fetal Evaluation

 Num Of Fetuses:    1
 Fetal Heart Rate:  152                          bpm
 Cardiac Activity:  Observed
 Presentation:      Variable
 Placenta:          Anterior, above cervical os
 P. Cord            Visualized, central
 Insertion:

 Amniotic Fluid
 AFI FV:      Subjectively within normal limits
                                             Larg Pckt:    5.78  cm
Biometry

 BPD:     52.9  mm     G. Age:  22w 0d                CI:        70.12   70 - 86
                                                      FL/HC:      17.9   18.4 -

 HC:     201.5  mm     G. Age:  22w 2d       32  %    HC/AC:      1.17   1.06 -

 AC:     172.7  mm     G. Age:  22w 1d       36  %    FL/BPD:     68.1   71 - 87
 FL:        36  mm     G. Age:  21w 3d       12  %    FL/AC:      20.8   20 - 24
 HUM:     34.5  mm     G. Age:  21w 6d       32  %
 CER:     24.2  mm     G. Age:  22w 1d       47  %

 Est. FW:     460  gm           1 lb     35  %
Gestational Age

 LMP:           22w 3d        Date:  02/09/14                 EDD:   11/16/14
 U/S Today:     22w 0d                                        EDD:   11/19/14
 Best:          22w 3d     Det. By:  LMP  (02/09/14)          EDD:   11/16/14
Anatomy

 Cranium:          Appears normal         Aortic Arch:      Appears normal
 Fetal Cavum:      Appears normal         Ductal Arch:      Appears normal
 Ventricles:       Appears normal         Diaphragm:        Appears normal
 Choroid Plexus:   Appears normal         Stomach:          Appears normal, left
                                                            sided
 Cerebellum:       Appears normal         Abdomen:          Appears normal
 Posterior Fossa:  Appears normal         Abdominal Wall:   Not well visualized
 Nuchal Fold:      Not applicable (>20    Cord Vessels:     Appears normal (3
                   wks GA)                                  vessel cord)
 Face:             Appears normal         Kidneys:          Appear normal
                   (orbits and profile)
 Lips:             Appears normal         Bladder:          Appears normal
 Heart:            Appears normal         Spine:            Not well visualized
                   (4CH, axis, and
                   situs)
 RVOT:             Appears normal         Lower             Appears normal
                                          Extremities:
 LVOT:             Appears normal         Upper             Appears normal
                                          Extremities:

 Other:  Fetus appears to be a male. Heels and  left 5th digit visualized.
         Technically difficult due to fetal position.
Targeted Anatomy

 Fetal Central Nervous System
 Lat. Ventricles:  4.8                    Cisterna Magna:
Cervix Uterus Adnexa

 Cervical Length:    2.71     cm

 Cervix:       Normal appearance by transabdominal scan.
 Uterus:       No abnormality visualized.
 Left Ovary:    Size(cm) L: 3.44 x W: 2.34 x H: 2.23  Volume(cc):
 Right Ovary:   Not visualized.
 Adnexa:     No abnormality visualized. No adnexal mass visualized.
Impression

 SIUP at 22+3 weeks
 Normal detailed fetal anatomy; limited views of CI and spine
 Normal amniotic fluid volume
 Measurements consistent with LMP dating
 TA views of cervix: probably normal; difficult to visualize
 external os; no funneling
Recommendations

 Follow-up ultrasound in 3-4 weeks to complete anatomy
 survey and reassess CL

## 2016-09-21 ENCOUNTER — Encounter (HOSPITAL_COMMUNITY): Payer: Self-pay | Admitting: *Deleted

## 2016-09-21 ENCOUNTER — Emergency Department (HOSPITAL_COMMUNITY)
Admission: EM | Admit: 2016-09-21 | Discharge: 2016-09-22 | Disposition: A | Discharge status: Home / Self Care | Payer: Medicaid Other | Attending: Emergency Medicine | Admitting: Emergency Medicine

## 2016-09-21 DIAGNOSIS — R05 Cough: Secondary | ICD-10-CM | POA: Insufficient documentation

## 2016-09-21 DIAGNOSIS — R6889 Other general symptoms and signs: Secondary | ICD-10-CM

## 2016-09-21 DIAGNOSIS — F17200 Nicotine dependence, unspecified, uncomplicated: Secondary | ICD-10-CM | POA: Insufficient documentation

## 2016-09-21 DIAGNOSIS — Z79899 Other long term (current) drug therapy: Secondary | ICD-10-CM | POA: Insufficient documentation

## 2016-09-21 DIAGNOSIS — R110 Nausea: Secondary | ICD-10-CM | POA: Insufficient documentation

## 2016-09-21 NOTE — ED Triage Notes (Signed)
Pt here "to see if I have the flu" Woke up this morning with an irritated throat, tried dayquil without improvement. Reports congestion and generalized body aches

## 2016-09-22 MED ORDER — ONDANSETRON HCL 4 MG PO TABS: 4.0000 mg | ORAL_TABLET | Freq: Four times a day (QID) | ORAL | 0 refills | Status: DC

## 2016-09-22 MED ORDER — OSELTAMIVIR PHOSPHATE 75 MG PO CAPS: 75.0000 mg | ORAL_CAPSULE | Freq: Two times a day (BID) | ORAL | 0 refills | Status: DC

## 2016-09-22 NOTE — ED Provider Notes (Signed)
[facility] Provider Note   CSN: [id number] Arrival date & time: 09/21/16  2305     History   Chief Complaint Chief Complaint  Patient presents with  . [id number]   In to see patient at 12:10 am and patient not in exam room.  HPI Emily Frazier is a 21 y.o. female who presents to the ED with flu like symptoms. She reports that she has had nausea but that early today she took Flagyl for trichomonas. She reports aching all over, slight cough. She is unsure about fever.   The history is provided by the patient. No language interpreter was used.  Influenza  Presenting symptoms: cough, myalgias and nausea   Severity:  Mild Onset quality:  Sudden Duration:  8 hours Progression:  Worsening Chronicity:  New Relieved by:  None tried Worsened by:  Nothing Ineffective treatments:  None tried   Past Medical History:  Diagnosis Date  . Medical history non-contributory     Patient Active Problem List   Diagnosis Date Noted  . Depo-Provera contraceptive status 12/09/2014  . Status post vaginal delivery 12/09/2014    Past Surgical History:  Procedure Laterality Date  . NO PAST SURGERIES    . WISDOM TOOTH EXTRACTION      OB History    Gravida Para Term Preterm AB Living   1 1 1  0 0 1   SAB TAB Ectopic Multiple Live Births   0 0 0 0 1       Home Medications    Prior to Admission medications   Medication Sig Start Date End Date Taking? Authorizing Provider  gentamicin (GARAMYCIN) 0.3 % ophthalmic solution Place 2 drops into the right eye 4 (four) times daily. 11/14/14   Geoffery Lyonsouglas Delo, MD  ibuprofen (ADVIL,MOTRIN) 800 MG tablet Take 1 tablet (800 mg total) by mouth 3 (three) times daily. 01/11/16   Fayrene HelperBowie Tran, PA-C  methocarbamol (ROBAXIN) 500 MG tablet Take 1 tablet (500 mg total) by mouth 2 (two) times daily. 01/11/16   Fayrene HelperBowie Tran, PA-C  ondansetron (ZOFRAN ODT) 4 MG disintegrating tablet Take 1 tablet (4 mg total) by mouth every 8 (eight) hours as needed for  nausea. 03/28/15   Eber HongBrian Miller, MD  ondansetron (ZOFRAN) 4 MG tablet Take 1 tablet (4 mg total) by mouth every 6 (six) hours. 09/22/16   Bertran Zeimet Orlene OchM Eliseo Withers, NP  oseltamivir (TAMIFLU) 75 MG capsule Take 1 capsule (75 mg total) by mouth every 12 (twelve) hours. 09/22/16   Zyrah Wiswell Orlene OchM Leniya Breit, NP  traMADol (ULTRAM) 50 MG tablet Take 1 tablet (50 mg total) by mouth every 6 (six) hours as needed. 11/14/14   Geoffery Lyonsouglas Delo, MD    Family History No family history on file.  Social History Social History  Substance Use Topics  . Smoking status: Current Every Day Smoker    Packs/day: 0.10  . Smokeless tobacco: Never Used  . Alcohol use No     Allergies   Patient has no known allergies.   Review of Systems Review of Systems  Respiratory: Positive for cough.   Gastrointestinal: Positive for nausea.  Musculoskeletal: Positive for myalgias.     Physical Exam Updated Vital Signs BP 114/69 (BP Location: Right Arm)   Pulse 91   Temp 97.4 F (36.3 C) (Oral)   Resp 20   Ht 5\' 6"  (1.676 m)   Wt 51.8 kg   LMP 09/03/2016   SpO2 98%   BMI 18.44 kg/m   Physical Exam   ED Treatments /  Results  Labs (all labs ordered are listed, but only abnormal results are displayed) Labs Reviewed - No data to display  Radiology No results found.  Procedures Procedures (including critical care time)  Medications Ordered in ED Medications - No data to display   Initial Impression / Assessment and Plan / ED Course  I have reviewed the triage vital signs and the nursing notes.    Final Clinical Impressions(s) / ED Diagnoses  SUBJECTIVE:  Emily Frazier is a 21 y.o. female who present complaining of flu-like symptoms: fevers, chills, myalgias, congestion, sore throat and cough for 8 hours. Denies dyspnea or wheezing.  OBJECTIVE: Appears moderately ill but not toxic; temperature as noted in vitals. Ears normal. Throat and pharynx normal.  Neck supple. No adenopathy in the neck. Sinuses non tender. The chest  is clear.  ASSESSMENT: Influenza  PLAN: Symptomatic therapy suggested: rest, increase fluids, gargle prn for sore throat, use mist of vaporizer prn, BRAT diet and return prn if symptoms persist or worsen.  Final diagnoses:  Flu-like symptoms    New Prescriptions New Prescriptions   ONDANSETRON (ZOFRAN) 4 MG TABLET    Take 1 tablet (4 mg total) by mouth every 6 (six) hours.   OSELTAMIVIR (TAMIFLU) 75 MG CAPSULE    Take 1 capsule (75 mg total) by mouth every 12 (twelve) hours.     [address] [place], NP 09/22/16 0105    Dione Booze, MD 09/22/16 [phone]

## 2016-09-22 NOTE — Discharge Instructions (Signed)
Your can take Robitussin expectorant for cough, sudafed for congestion, tylenol and ibuprofen for fever or aching.

## 2017-02-05 ENCOUNTER — Emergency Department (HOSPITAL_COMMUNITY): Payer: Medicaid Other

## 2017-02-05 ENCOUNTER — Encounter: Payer: Self-pay | Admitting: Emergency Medicine

## 2017-02-05 ENCOUNTER — Emergency Department (HOSPITAL_COMMUNITY)
Admission: EM | Admit: 2017-02-05 | Discharge: 2017-02-05 | Disposition: A | Discharge status: Home / Self Care | Payer: Medicaid Other | Attending: Emergency Medicine | Admitting: Emergency Medicine

## 2017-02-05 DIAGNOSIS — F17200 Nicotine dependence, unspecified, uncomplicated: Secondary | ICD-10-CM | POA: Insufficient documentation

## 2017-02-05 DIAGNOSIS — W230XXA Caught, crushed, jammed, or pinched between moving objects, initial encounter: Secondary | ICD-10-CM | POA: Insufficient documentation

## 2017-02-05 DIAGNOSIS — Y999 Unspecified external cause status: Secondary | ICD-10-CM | POA: Insufficient documentation

## 2017-02-05 DIAGNOSIS — Y929 Unspecified place or not applicable: Secondary | ICD-10-CM | POA: Insufficient documentation

## 2017-02-05 DIAGNOSIS — S61214A Laceration without foreign body of right ring finger without damage to nail, initial encounter: Secondary | ICD-10-CM | POA: Insufficient documentation

## 2017-02-05 DIAGNOSIS — S6991XA Unspecified injury of right wrist, hand and finger(s), initial encounter: Secondary | ICD-10-CM

## 2017-02-05 DIAGNOSIS — Y9389 Activity, other specified: Secondary | ICD-10-CM | POA: Insufficient documentation

## 2017-02-05 MED ORDER — CEPHALEXIN 500 MG PO CAPS: 500.0000 mg | ORAL_CAPSULE | Freq: Four times a day (QID) | ORAL | 0 refills | Status: DC

## 2017-02-05 MED ORDER — DICLOFENAC SODIUM 50 MG PO TBEC: 50.0000 mg | DELAYED_RELEASE_TABLET | Freq: Two times a day (BID) | ORAL | 0 refills | Status: DC | PRN

## 2017-02-05 NOTE — Consult Note (Signed)
Reason for Consult: Right ring finger injury Referring Physician: Dr. Zegeler/emergency room staff  Emily Frazier is an 21 y.o. female.  HPI: Patient is a very pleasant 21 year old female who presented to the emergency room for evaluation of her right hand and particularly her right ring finger. He notes that she sustained an injury this past Sunday she was trying to put awake hand goods in a high cabinet. She states she jumped up to put a can in the cabinet and hit her right ring finger on the cabinet door. She had pain, mild swelling and sustained a small laceration/abrasion about the radial aspect of the finger at the level of the PIP joint. Since that time she states she has noted that the abrasion has become somewhat more erythematous and tender. She was concern for possible infection she presented to the emergency room for evaluation. We were consulted by the emergency room staff to ensure she did not have infectious process. Patient is very pleasant, she denies significant pain at this juncture but localized tenderness at the level of the PIP. She states it is mildly tender with flexion and extension. She denies fever, chills, or constitutional symptoms at this juncture.  Past Medical History:  Diagnosis Date  . Medical history non-contributory     Past Surgical History:  Procedure Laterality Date  . NO PAST SURGERIES    . WISDOM TOOTH EXTRACTION      No family history on file.  Social History:  reports that she has been smoking.  She has been smoking about 0.10 packs per day. She has never used smokeless tobacco. She reports that she does not drink alcohol or use drugs.  Allergies: No Known Allergies  Medications: I have reviewed the patient's current medications.   No results found for this or any previous visit (from the past 48 hour(s)).  Dg Hand Complete Right  Result Date: 02/05/2017 CLINICAL DATA:  Initial encounter for Jammed right hand on cabinet earlier today; Pain is  mostly in 4th digit, ring finger, but pain also extends through entire hand EXAM: RIGHT HAND - COMPLETE 3+ VIEW COMPARISON:  None. FINDINGS: No acute fracture or dislocation.  No definite soft tissue swelling. IMPRESSION: No acute osseous abnormality. Electronically Signed   By: Jeronimo GreavesKyle  Talbot M.D.   On: 02/05/2017 16:41    Review of Systems  Constitutional: Negative.   HENT: Negative.   Eyes: Negative.   Respiratory: Negative.   Cardiovascular: Negative.   Gastrointestinal: Negative.   Genitourinary: Negative.   Musculoskeletal:       See history of present illness  Skin:       See history of present illness  Neurological: Negative.   Endo/Heme/Allergies: Negative.   Psychiatric/Behavioral: Negative.    Blood pressure 100/62, pulse 89, temperature 97.9 F (36.6 C), temperature source Oral, resp. rate 14, height 5' 6.5" (1.689 m), weight 53.5 kg (118 lb), last menstrual period 02/01/2017, SpO2 100 %. Physical Exam The patient is alert and oriented in no acute distress. The patient complains of pain in the affected upper extremity. Evaluation of the right ring finger shows she has a 1 cm somewhat deep abrasion about the radial aspect of the PIP there does not appear to be any deep space abscess present she has localized erythema about the abrasion. There is no purulence present. She has no ascending cellulitis. I should note FDS FDP are intact terminal extensor tendons intact central slip is intact as well. She is able to make a full active  fist and extension. Sensation and refill are intact.  The patient is noted to have a normal HEENT exam. Lung fields show equal chest expansion and no shortness of breath. Abdomen exam is nontender without distention. Lower extremity examination does not show any fracture dislocation or blood clot symptoms. Pelvis is stable and the neck and back are stable and nontender. Assessment/Plan: Right ring finger contusive injury with associated abrasion at the  level of the PIP I discussed with the patient local wound care. We have laced bacitracin ointment on the abrasion followed by Adaptic and a soft wrap. I have discussed with her she will need to keep this covered clean and dry when working. She may unwrap this and will wash her hands with antibiotic soap and water at least twice a day and rewrap this in the fashion as aforementioned. She states good understanding. Dressing supplies were given for home use. I discussed with the patient should she have any increased erythema, pain or swelling to contact us ASAP otherwise we would see her in the office in a week or as needed. We will cover her with Keflex 500 mg 1 by mouth 4 times a day over the next week. She will continue daily wound care for the residual part of this week and will see [place] if she has any difficulties in the office next week. All questions were encouraged and answered.   Hassie Mandt L 02/05/2017, 5:12 PM

## 2017-02-05 NOTE — ED Provider Notes (Signed)
[facility] Provider Note    By signing my name below, I, Earmon Phoenix, attest that this documentation has been prepared under the direction and in the presence of [facility], [place]. Electronically Signed: Earmon Phoenix, ED Scribe. 02/05/17. 5:13 PM.    History   Chief Complaint Chief Complaint  Patient presents with  . Finger Injury    The history is provided by the patient and medical records. No language interpreter was used.    Emily Frazier is a 21 y.o. female who presents to the Emergency Department complaining of an injury to the right fourth finger that occurred two days ago. She reports an associated laceration to the finger and states the pain is now radiating down into her wrist. Pt states she jammed the finger while putting something away in a cabinet at home. She states the pain has been worsening since the incident occurred. She has not taken anything for pain. Making a fist or moving the fourth digit increases the pain. Resting the hand helps alleviate the pain. She denies numbness, tingling or weakness of the right hand or fingers, fever, chills, nausea, vomiting.   Past Medical History:  Diagnosis Date  . Medical history non-contributory     Patient Active Problem List   Diagnosis Date Noted  . Depo-Provera contraceptive status 12/09/2014  . Status post vaginal delivery 12/09/2014    Past Surgical History:  Procedure Laterality Date  . NO PAST SURGERIES    . WISDOM TOOTH EXTRACTION      OB History    Gravida Para Term Preterm AB Living   1 1 1  0 0 1   SAB TAB Ectopic Multiple Live Births   0 0 0 0 1       Home Medications    Prior to Admission medications   Medication Sig Start Date End Date Taking? Authorizing Provider  methocarbamol (ROBAXIN) 500 MG tablet Take 1 tablet (500 mg total) by mouth 2 (two) times daily. 01/11/16  Yes Fayrene Helper, PA-C  ondansetron (ZOFRAN ODT) 4 MG disintegrating tablet Take 1 tablet (4 mg total) by  mouth every 8 (eight) hours as needed for nausea. 03/28/15  Yes Eber Hong, MD  ondansetron (ZOFRAN) 4 MG tablet Take 1 tablet (4 mg total) by mouth every 6 (six) hours. 09/22/16  Yes Neese, Hope M, NP  oseltamivir (TAMIFLU) 75 MG capsule Take 1 capsule (75 mg total) by mouth every 12 (twelve) hours. 09/22/16  Yes Neese, Hope M, NP  traMADol (ULTRAM) 50 MG tablet Take 1 tablet (50 mg total) by mouth every 6 (six) hours as needed. 11/14/14  Yes Delo, Riley Lam, MD  cephALEXin (KEFLEX) 500 MG capsule Take 1 capsule (500 mg total) by mouth 4 (four) times daily. 02/05/17   Janne Napoleon, NP  diclofenac (VOLTAREN) 50 MG EC tablet Take 1 tablet (50 mg total) by mouth 2 (two) times daily as needed for mild pain. 02/05/17   Janne Napoleon, NP    Family History No family history on file.  Social History Social History  Substance Use Topics  . Smoking status: Current Every Day Smoker    Packs/day: 0.10  . Smokeless tobacco: Never Used  . Alcohol use No     Allergies   Patient has no known allergies.   Review of Systems Review of Systems  Constitutional: Negative for chills and fever.  Gastrointestinal: Negative for nausea and vomiting.  Musculoskeletal: Positive for arthralgias and joint swelling.  Skin: Positive for wound.  Neurological:  Negative for weakness and numbness.     Physical Exam Updated Vital Signs BP 100/62   Pulse 89   Temp 97.9 F (36.6 C) (Oral)   Resp 14   Ht 5' 6.5" (1.689 m)   Wt 118 lb (53.5 kg)   LMP 02/01/2017   SpO2 100%   BMI 18.76 kg/m   Physical Exam  Constitutional: She is oriented to person, place, and time. She appears well-developed and well-nourished. No distress.  Eyes: EOM are normal.  Neck: Neck supple.  Cardiovascular: Normal rate.   Radial pulses 2+ bilaterally. Adequate circulation.  Pulmonary/Chest: Effort normal.  Abdominal: Soft. There is no tenderness.  Musculoskeletal: She exhibits tenderness. She exhibits no edema or deformity.        Right hand: She exhibits tenderness, laceration and swelling. She exhibits normal range of motion, normal capillary refill and no deformity. Decreased sensation noted. Normal strength noted. She exhibits no thumb/finger opposition.       Hands: Superficial laceration to the right ring finger. No pain with movement at the DIP. Minimal pain with movement at the PIP. Swelling at PIP. Small area of erythema surrounding the laceration.  Neurological: She is alert and oriented to person, place, and time.  Skin: Skin is warm and dry.  Psychiatric: She has a normal mood and affect. Her behavior is normal.  Nursing note and vitals reviewed.    ED Treatments / Results  DIAGNOSTIC STUDIES: Oxygen Saturation is 100% on RA, normal by my interpretation.   COORDINATION OF CARE: 4:43 PM- Will wait for X-Ray to result. Pt verbalizes understanding and agrees to plan.  4:47 PM- Spoke with hand specialist. Will see prior to discharge.   5:10 PM- Per Dr. Amanda Pea, will prescribe Keflex 500 mg QID x 10 days and have pt follow up in his office in one week if necessary.   Medications - No data to display  Labs (all labs ordered are listed, but only abnormal results are displayed) Labs Reviewed - No data to display   Radiology Dg Hand Complete Right  Result Date: 02/05/2017 CLINICAL DATA:  Initial encounter for Jammed right hand on cabinet earlier today; Pain is mostly in 4th digit, ring finger, but pain also extends through entire hand EXAM: RIGHT HAND - COMPLETE 3+ VIEW COMPARISON:  None. FINDINGS: No acute fracture or dislocation.  No definite soft tissue swelling. IMPRESSION: No acute osseous abnormality. Electronically Signed   By: Jeronimo Greaves M.D.   On: 02/05/2017 16:41    Procedures Procedures (including critical care time)  Medications Ordered in ED Medications - No data to display   Initial Impression / Assessment and Plan / ED Course  I have reviewed the triage vital signs and the nursing  notes. Patient presenting after an injury to the right fourth digit that occurred two days ago. She reports worsening pain and redness as well as a small laceration to the finger. She states it has become increasingly more difficult to move the fingers since the incident. X-Ray negative for obvious fracture or dislocation. Pt seen by Dr. Amanda Pea and advised to follow up with him if symptoms not improving after taking Keflex. Patient given prescription for Keflex while in ED wound care discussed, conservative therapy recommended and discussed. Patient will be discharged home & is agreeable with above plan. Returns precautions discussed. Pt appears safe for discharge.   Final Clinical Impressions(s) / ED Diagnoses   Final diagnoses:  Injury of finger of right hand, initial encounter  New Prescriptions New Prescriptions   CEPHALEXIN (KEFLEX) 500 MG CAPSULE    Take 1 capsule (500 mg total) by mouth 4 (four) times daily.   DICLOFENAC (VOLTAREN) 50 MG EC TABLET    Take 1 tablet (50 mg total) by mouth 2 (two) times daily as needed for mild pain.   I personally performed the services described in this documentation, which was scribed in my presence. The recorded information has been reviewed and is accurate.     Kerrie Buffaloeese, Hope Dorm[place]M, [place] 02/05/17 1719    Tegeler, Canary Brimhristopher J, MD 02/06/17 [phone]

## 2017-02-05 NOTE — ED Triage Notes (Signed)
Patient states jammed finger in cabinet on Sunday ( 2 days ago) patient notes pain in finger has gotten worse and feels like it has spread to her wrist. Denies fevers chills, no redness, rash noted. Small healing laceration noted, non tender to mild palpation. Radial and ulnar pulses 2+. Patient has full ROM with fingers increased pain with clenching fist.

## 2017-02-05 NOTE — ED Notes (Signed)
Pt verbalized understanding discharge instructions and denies any further needs or questions at this time. VS stable, ambulatory and steady gait.   

## 2017-02-05 NOTE — Discharge Instructions (Signed)
Clean the wound as discussed. Take the medication for infection and pain. Follow up with Dr. Amanda PeaGramig.

## 2017-06-19 ENCOUNTER — Ambulatory Visit (INDEPENDENT_AMBULATORY_CARE_PROVIDER_SITE_OTHER): Payer: Medicaid Other | Admitting: General Practice

## 2017-06-19 DIAGNOSIS — O209 Hemorrhage in early pregnancy, unspecified: Secondary | ICD-10-CM

## 2017-06-19 DIAGNOSIS — Z3201 Encounter for pregnancy test, result positive: Secondary | ICD-10-CM

## 2017-06-19 LAB — POCT PREGNANCY, URINE: PREG TEST UR: POSITIVE — AB

## 2017-06-19 NOTE — Progress Notes (Addendum)
Patient here for UPT today. UPT +. Patient reports first positive home test earlier today. LMP beginning of October lasting three weeks. Patient's bleeding just stopped yesterday. Reports LMP prior to that beginning of September. Patient denies taking medications or vitamins. Discussed with Dr Earlene Plateravis who recommends beta hcg today and stat bhcg Friday. Informed patient of plan of care and trending bhcg process. Patient verbalized understanding & had no questions    I have reviewed this chart and agree with the RN assessment and management.    Emily LenisK. Meryl Davis, M.D. Attending Obstetrician & Gynecologist, [facility], [facility]

## 2017-06-20 ENCOUNTER — Emergency Department (HOSPITAL_COMMUNITY)
Admission: EM | Admit: 2017-06-20 | Discharge: 2017-06-20 | Disposition: A | Discharge status: Home / Self Care | Payer: Medicaid Other | Attending: Emergency Medicine | Admitting: Emergency Medicine

## 2017-06-20 ENCOUNTER — Encounter (HOSPITAL_COMMUNITY): Payer: Self-pay | Admitting: *Deleted

## 2017-06-20 DIAGNOSIS — R197 Diarrhea, unspecified: Secondary | ICD-10-CM | POA: Insufficient documentation

## 2017-06-20 DIAGNOSIS — Z5321 Procedure and treatment not carried out due to patient leaving prior to being seen by health care provider: Secondary | ICD-10-CM | POA: Insufficient documentation

## 2017-06-20 LAB — CBC
HCT: 42.1 % (ref 36.0–46.0)
HEMOGLOBIN: 14.2 g/dL (ref 12.0–15.0)
MCH: 30.9 pg (ref 26.0–34.0)
MCHC: 33.7 g/dL (ref 30.0–36.0)
MCV: 91.5 fL (ref 78.0–100.0)
Platelets: 218 10*3/uL (ref 150–400)
RBC: 4.6 MIL/uL (ref 3.87–5.11)
RDW: 11.9 % (ref 11.5–15.5)
WBC: 9.1 10*3/uL (ref 4.0–10.5)

## 2017-06-20 LAB — COMPREHENSIVE METABOLIC PANEL
ALBUMIN: 4.6 g/dL (ref 3.5–5.0)
ALK PHOS: 56 U/L (ref 38–126)
ALT: 14 U/L (ref 14–54)
ANION GAP: 7 (ref 5–15)
AST: 22 U/L (ref 15–41)
BUN: 8 mg/dL (ref 6–20)
CALCIUM: 9.5 mg/dL (ref 8.9–10.3)
CHLORIDE: 106 mmol/L (ref 101–111)
CO2: 25 mmol/L (ref 22–32)
Creatinine, Ser: 0.66 mg/dL (ref 0.44–1.00)
GFR calc non Af Amer: >60 mL/min (ref >60)
Glucose, Bld: 108 mg/dL — ABNORMAL HIGH (ref 65–99)
Potassium: 3.7 mmol/L (ref 3.5–5.1)
Sodium: 138 mmol/L (ref 135–145)
Total Bilirubin: 0.5 mg/dL (ref 0.3–1.2)
Total Protein: 7.4 g/dL (ref 6.5–8.1)

## 2017-06-20 LAB — BETA HCG QUANT (REF LAB): hCG Quant: 30 m[IU]/mL

## 2017-06-20 LAB — LIPASE, BLOOD: LIPASE: 28 U/L (ref 11–51)

## 2017-06-20 NOTE — ED Triage Notes (Signed)
To ED for eval of abd cramping and diarrhea. [facility] and found out [facility] was [facility]. She is to go tomorrow for more blood work. States since last pm she has had intermittent cramps and diarrhea. States she has had vaginal bleeding for the past 3 wks but now just spotting.

## 2017-06-20 NOTE — ED Notes (Signed)
No answer x3

## 2017-06-20 NOTE — ED Notes (Signed)
No answer x2 for room 

## 2017-06-21 ENCOUNTER — Other Ambulatory Visit: Payer: Medicaid Other | Admitting: General Practice

## 2017-06-21 DIAGNOSIS — O3680X0 Pregnancy with inconclusive fetal viability, not applicable or unspecified: Secondary | ICD-10-CM

## 2017-06-21 LAB — HCG, QUANTITATIVE, PREGNANCY: HCG, BETA CHAIN, QUANT, S: 15 m[IU]/mL — AB (ref <5)

## 2017-06-21 NOTE — Progress Notes (Signed)
Patient here for stat bhcg today. Patient denies bleeding or pain. Discussed with patient having her wait in lobby in results and updated plan of care. Patient verbalized understanding.  Reviewed patient's history/results with Dr Shawnie PonsPratt who agrees with SAB. Patient needs follow up lab in 1 week and provider visit in two weeks. Informed patient of results & recommended plan of care. Patient appropriately upset & verbalizes understanding. Patient has no questions

## 2017-06-24 NOTE — Progress Notes (Signed)
Patient seen and assessed by nursing staff.  Agree with documentation and plan.  

## 2017-06-28 ENCOUNTER — Other Ambulatory Visit: Payer: Self-pay

## 2017-06-28 DIAGNOSIS — Z3201 Encounter for pregnancy test, result positive: Secondary | ICD-10-CM

## 2017-06-29 LAB — BETA HCG QUANT (REF LAB): hCG Quant: 1 m[IU]/mL

## 2017-07-01 ENCOUNTER — Ambulatory Visit: Payer: Medicaid Other | Admitting: Advanced Practice Midwife

## 2017-07-04 ENCOUNTER — Encounter: Payer: Self-pay | Admitting: General Practice

## 2017-07-08 ENCOUNTER — Encounter: Payer: Self-pay | Admitting: Advanced Practice Midwife

## 2017-07-08 ENCOUNTER — Ambulatory Visit (INDEPENDENT_AMBULATORY_CARE_PROVIDER_SITE_OTHER): Payer: Medicaid Other | Admitting: Advanced Practice Midwife

## 2017-07-08 VITALS — BP 92/42 | HR 72 | Wt 118.2 lb

## 2017-07-08 DIAGNOSIS — Z3169 Encounter for other general counseling and advice on procreation: Secondary | ICD-10-CM

## 2017-07-08 DIAGNOSIS — O039 Complete or unspecified spontaneous abortion without complication: Secondary | ICD-10-CM | POA: Diagnosis not present

## 2017-07-08 NOTE — Progress Notes (Signed)
Subjective:     Patient ID: Emily Frazier, female   DOB: 1995-11-18, 21 y.o.   MRN: [id number]  Emily Songstershley M Frazier is a 21 y.o. G2P1001 who presents today for SAB follow up. She denies any bleeding. Last had bleeding about 2 weeks ago. She is not interested in birth control today. She desires a pregnancy at this time.    Gynecologic Exam  The patient's pertinent negatives include no pelvic pain or vaginal discharge. This is a new problem. The current episode started more than 1 year ago. The problem occurs intermittently. The problem has been unchanged. The patient is experiencing no pain. She is not pregnant. Pertinent negatives include no chills, fever, nausea or vomiting. The vaginal discharge was normal. There has been no bleeding. Menstrual history: SAB ended around 06/19/17.     Review of Systems  Constitutional: Negative for chills and fever.  Gastrointestinal: Negative for nausea and vomiting.  Genitourinary: Negative for pelvic pain, vaginal bleeding and vaginal discharge.       Objective:   Physical Exam  Constitutional: She is oriented to person, place, and time. She appears well-developed and well-nourished. No distress.  HENT:  Head: Normocephalic.  Cardiovascular: Normal rate.  Pulmonary/Chest: Effort normal.  Neurological: She is alert and oriented to person, place, and time.  Skin: Skin is warm and dry.  Psychiatric: She has a normal mood and affect.  Nursing note and vitals reviewed.      Assessment:     1. SAB (spontaneous abortion)   2. Encounter for preconception consultation        Plan:     Preconception counseling today  FU as needed

## 2017-07-29 ENCOUNTER — Other Ambulatory Visit: Payer: Self-pay

## 2017-07-29 ENCOUNTER — Ambulatory Visit (HOSPITAL_COMMUNITY)
Admission: EM | Admit: 2017-07-29 | Discharge: 2017-07-29 | Disposition: A | Discharge status: Home / Self Care | Payer: Medicaid Other | Attending: Family Medicine | Admitting: Family Medicine

## 2017-07-29 ENCOUNTER — Encounter (HOSPITAL_COMMUNITY): Payer: Self-pay | Admitting: *Deleted

## 2017-07-29 DIAGNOSIS — R0982 Postnasal drip: Secondary | ICD-10-CM

## 2017-07-29 DIAGNOSIS — R0981 Nasal congestion: Secondary | ICD-10-CM | POA: Diagnosis not present

## 2017-07-29 DIAGNOSIS — J029 Acute pharyngitis, unspecified: Secondary | ICD-10-CM

## 2017-07-29 DIAGNOSIS — H6983 Other specified disorders of Eustachian tube, bilateral: Secondary | ICD-10-CM

## 2017-07-29 NOTE — Discharge Instructions (Signed)
Take Allegra daily to help with drainage. May add Chlor-Trimeton 2 or 4 mg every 4 hours to also help with drainage. This may cause drowsiness. Drink water before bedtime and upon getting up in the morning. Cepacol lozenges for sore throat pain.

## 2017-07-29 NOTE — ED Triage Notes (Signed)
Started last Saturday, feel pressure in head, sore throat,

## 2017-07-29 NOTE — ED Provider Notes (Signed)
[facility]    CSN: [id number] Arrival date & time: 07/29/17  1304     History   Chief Complaint Chief Complaint  Patient presents with  . Sore Throat    HPI Emily Frazier is a 21 y.o. female.   21 year old female having sore throat for about 10 days. Awoke with it and tends to be worse in the morning. She also has PND, clearing her throat frequently. Some head congestion. Denies fever or chills. Occasionally she will have discomfort in both ears.      Past Medical History:  Diagnosis Date  . Medical history non-contributory     Patient Active Problem List   Diagnosis Date Noted  . Depo-Provera contraceptive status 12/09/2014  . Status post vaginal delivery 12/09/2014    Past Surgical History:  Procedure Laterality Date  . NO PAST SURGERIES    . WISDOM TOOTH EXTRACTION      OB History    Gravida Para Term Preterm AB Living   2 1 1  0 0 1   SAB TAB Ectopic Multiple Live Births   0 0 0 0 1       Home Medications    Prior to Admission medications   Not on File    Family History Family History  Family history unknown: Yes    Social History Social History   Tobacco Use  . Smoking status: Former Smoker    Packs/day: 0.10    Last attempt to quit: 08/27/2016    Years since quitting: 0.9  . Smokeless tobacco: Never Used  Substance Use Topics  . Alcohol use: No  . Drug use: No     Allergies   Patient has no known allergies.   Review of Systems Review of Systems  Constitutional: Negative for activity change, appetite change, chills, fatigue and fever.  HENT: Positive for congestion, postnasal drip, rhinorrhea and sore throat. Negative for facial swelling.   Eyes: Negative.   Respiratory: Negative.   Cardiovascular: Negative.   Musculoskeletal: Negative for neck pain and neck stiffness.  Skin: Negative for pallor and rash.  Neurological: Negative.   All other systems reviewed and are negative.    Physical Exam Triage Vital  Signs ED Triage Vitals  Enc Vitals Group     BP 07/29/17 1412 (!) 107/57     Pulse Rate 07/29/17 1412 83     Resp --      Temp 07/29/17 1412 97.9 F (36.6 C)     Temp Source 07/29/17 1412 Oral     SpO2 07/29/17 1412 100 %     Weight --      Height --      Head Circumference --      Peak Flow --      Pain Score 07/29/17 1409 6     Pain Loc --      Pain Edu? --      Excl. in GC? --    No data found.  Updated Vital Signs BP (!) 107/57 (BP Location: Left Arm)   Pulse 83   Temp 97.9 F (36.6 C) (Oral)   LMP 07/17/2017 (Approximate)   SpO2 100%   Visual Acuity Right Eye Distance:   Left Eye Distance:   Bilateral Distance:    Right Eye Near:   Left Eye Near:    Bilateral Near:     Physical Exam  Constitutional: She is oriented to person, place, and time. She appears well-developed and well-nourished. No distress.  HENT:  Mouth/Throat: No uvula swelling. No posterior oropharyngeal edema or tonsillar abscesses.  Oropharynx with minor erythema and cobblestoning and clear PND. No exudates or swelling.  Neck: Normal range of motion. Neck supple.  Cardiovascular: Normal rate and regular rhythm.  Pulmonary/Chest: Effort normal and breath sounds normal. No respiratory distress.  Musculoskeletal: Normal range of motion. She exhibits no edema.  Lymphadenopathy:    She has no cervical adenopathy.  Neurological: She is alert and oriented to person, place, and time.  Skin: Skin is warm and dry. No rash noted.  Psychiatric: She has a normal mood and affect.     UC Treatments / Results  Labs (all labs ordered are listed, but only abnormal results are displayed) Labs Reviewed - No data to display  EKG  EKG Interpretation None       Radiology No results found.  Procedures Procedures (including critical care time)  Medications Ordered in UC Medications - No data to display   Initial Impression / Assessment and Plan / UC Course  I have reviewed the triage vital  signs and the nursing notes.  Pertinent labs & imaging results that were available during my care of the patient were reviewed by me and considered in my medical decision making (see chart for details).    Take Allegra daily to help with drainage. May add Chlor-Trimeton 2 or 4 mg every 4 hours to also help with drainage. This may cause drowsiness. Drink water before bedtime and upon getting up in the morning. Cepacol lozenges for sore throat pain.    Final Clinical Impressions(s) / UC Diagnoses   Final diagnoses:  Sore throat  PND (post-nasal drip)  ETD (Eustachian tube dysfunction), bilateral    ED Discharge Orders    None       Controlled Substance Prescriptions [place] Controlled Substance Registry consulted? Not Applicable   Hayden RasmussenMabe, Johnie Stadel, NP 07/29/17 1451

## 2017-08-27 NOTE — L&D Delivery Note (Addendum)
Patient: Emily Frazier MRN: [id number]  GBS status: negative, IAP given: no   Patient is a 22 y.o. now [id number] s/p NSVD at 102w0d, who was admitted for SOL. SROM 6h 73m prior to delivery with clear fluid.    Delivery Note At 1:22 PM a viable female was delivered via Vaginal, Spontaneous (Presentation: cephalic; LOA).  APGAR: 9, 9; weight: pending (appears AGA).   Placenta status: intact, 3-vessel cord, discarded.  Cord:  with the following complications: none.  Cord pH: not collected  Anesthesia: epidural  Episiotomy: None Lacerations: None Suture Repair: none Est. Blood Loss (mL): 100  Mom to postpartum.  Baby to Couplet care / Skin to Skin.  Gwenevere Abbot 06/14/2018, 2:01 PM   Head delivered LOA. No nuchal cord present. Shoulder and body delivered in usual fashion. Infant with spontaneous cry, placed on mother's abdomen, dried and bulb suctioned. Cord clamped x 2 after 1-minute delay, and cut by family member. Cord blood drawn. Placenta delivered spontaneously with gentle cord traction. Fundus firm with massage and Pitocin. Perineum inspected and found to have no lacerations.  Please schedule this patient for Postpartum visit in: 4 weeks with the following provider: Any provider For C/S patients schedule nurse incision check in weeks 2 weeks: no Low risk pregnancy complicated by: none Delivery mode:  SVD Anticipated Birth Control:  Nexplanon PP Procedures needed: none  Schedule Integrated BH visit: no

## 2017-10-14 ENCOUNTER — Inpatient Hospital Stay (HOSPITAL_COMMUNITY)
Admission: AD | Admit: 2017-10-14 | Discharge: 2017-10-14 | Disposition: A | Discharge status: Home / Self Care | Payer: BLUE CROSS/BLUE SHIELD | Source: Ambulatory Visit | Attending: Obstetrics & Gynecology | Admitting: Obstetrics & Gynecology

## 2017-10-14 ENCOUNTER — Encounter (HOSPITAL_COMMUNITY): Payer: Self-pay | Admitting: *Deleted

## 2017-10-14 ENCOUNTER — Other Ambulatory Visit: Payer: Self-pay

## 2017-10-14 ENCOUNTER — Inpatient Hospital Stay (HOSPITAL_COMMUNITY): Payer: BLUE CROSS/BLUE SHIELD

## 2017-10-14 DIAGNOSIS — O99281 Endocrine, nutritional and metabolic diseases complicating pregnancy, first trimester: Secondary | ICD-10-CM | POA: Diagnosis not present

## 2017-10-14 DIAGNOSIS — O26891 Other specified pregnancy related conditions, first trimester: Secondary | ICD-10-CM | POA: Diagnosis not present

## 2017-10-14 DIAGNOSIS — E860 Dehydration: Secondary | ICD-10-CM | POA: Insufficient documentation

## 2017-10-14 DIAGNOSIS — K529 Noninfective gastroenteritis and colitis, unspecified: Secondary | ICD-10-CM | POA: Insufficient documentation

## 2017-10-14 DIAGNOSIS — R197 Diarrhea, unspecified: Secondary | ICD-10-CM | POA: Insufficient documentation

## 2017-10-14 DIAGNOSIS — O99611 Diseases of the digestive system complicating pregnancy, first trimester: Secondary | ICD-10-CM | POA: Diagnosis not present

## 2017-10-14 DIAGNOSIS — O3680X0 Pregnancy with inconclusive fetal viability, not applicable or unspecified: Secondary | ICD-10-CM

## 2017-10-14 DIAGNOSIS — R824 Acetonuria: Secondary | ICD-10-CM | POA: Insufficient documentation

## 2017-10-14 DIAGNOSIS — N83292 Other ovarian cyst, left side: Secondary | ICD-10-CM | POA: Insufficient documentation

## 2017-10-14 DIAGNOSIS — R1030 Lower abdominal pain, unspecified: Secondary | ICD-10-CM | POA: Insufficient documentation

## 2017-10-14 DIAGNOSIS — R109 Unspecified abdominal pain: Secondary | ICD-10-CM

## 2017-10-14 DIAGNOSIS — O3481 Maternal care for other abnormalities of pelvic organs, first trimester: Secondary | ICD-10-CM | POA: Diagnosis not present

## 2017-10-14 DIAGNOSIS — O2621 Pregnancy care for patient with recurrent pregnancy loss, first trimester: Secondary | ICD-10-CM | POA: Insufficient documentation

## 2017-10-14 DIAGNOSIS — Z87891 Personal history of nicotine dependence: Secondary | ICD-10-CM | POA: Diagnosis not present

## 2017-10-14 DIAGNOSIS — N83202 Unspecified ovarian cyst, left side: Secondary | ICD-10-CM

## 2017-10-14 DIAGNOSIS — O26899 Other specified pregnancy related conditions, unspecified trimester: Secondary | ICD-10-CM

## 2017-10-14 DIAGNOSIS — Z3A01 Less than 8 weeks gestation of pregnancy: Secondary | ICD-10-CM

## 2017-10-14 HISTORY — DX: Gestational (pregnancy-induced) hypertension without significant proteinuria, unspecified trimester: O13.9

## 2017-10-14 LAB — URINALYSIS, ROUTINE W REFLEX MICROSCOPIC
BILIRUBIN URINE: NEGATIVE
GLUCOSE, UA: NEGATIVE mg/dL
KETONES UR: 80 mg/dL — AB
Leukocytes, UA: NEGATIVE
NITRITE: NEGATIVE
PROTEIN: NEGATIVE mg/dL
Specific Gravity, Urine: 1.026 (ref 1.005–1.030)
pH: 6 (ref 5.0–8.0)

## 2017-10-14 LAB — CBC
HCT: 36.7 % (ref 36.0–46.0)
Hemoglobin: 12.8 g/dL (ref 12.0–15.0)
MCH: 31.3 pg (ref 26.0–34.0)
MCHC: 34.9 g/dL (ref 30.0–36.0)
MCV: 89.7 fL (ref 78.0–100.0)
PLATELETS: 221 10*3/uL (ref 150–400)
RBC: 4.09 MIL/uL (ref 3.87–5.11)
RDW: 11.9 % (ref 11.5–15.5)
WBC: 8.1 10*3/uL (ref 4.0–10.5)

## 2017-10-14 LAB — POCT PREGNANCY, URINE: PREG TEST UR: POSITIVE — AB

## 2017-10-14 LAB — HCG, QUANTITATIVE, PREGNANCY: hCG, Beta Chain, Quant, S: 159 m[IU]/mL — ABNORMAL HIGH (ref <5)

## 2017-10-14 LAB — ABO/RH: ABO/RH(D): O POS

## 2017-10-14 MED ORDER — FAMOTIDINE IN NACL 20-0.9 MG/50ML-% IV SOLN
20.0000 mg | Freq: Once | INTRAVENOUS | Status: AC
  Administered 2017-10-14: 20 mg via INTRAVENOUS
  Filled 2017-10-14: qty 50

## 2017-10-14 MED ORDER — LACTATED RINGERS IV BOLUS (SEPSIS)
1000.0000 mL | Freq: Once | INTRAVENOUS | Status: AC
  Administered 2017-10-14: 1000 mL via INTRAVENOUS

## 2017-10-14 MED ORDER — PROMETHAZINE HCL 12.5 MG PO TABS
12.5000 mg | ORAL_TABLET | Freq: Four times a day (QID) | ORAL | 0 refills | Status: DC | PRN
Start: 2017-10-14 — End: 2018-03-24

## 2017-10-14 MED ORDER — METOCLOPRAMIDE HCL 5 MG/ML IJ SOLN
10.0000 mg | Freq: Once | INTRAMUSCULAR | Status: DC
  Filled 2017-10-14: qty 2

## 2017-10-14 MED ORDER — DEXTROSE 5 % IN LACTATED RINGERS IV BOLUS
1000.0000 mL | Freq: Once | INTRAVENOUS | Status: AC
  Administered 2017-10-14: 1000 mL via INTRAVENOUS

## 2017-10-14 NOTE — MAU Provider Note (Signed)
Chief Complaint  Patient presents with  . Abdominal Pain  . Diarrhea   HPI  Emily Frazier is a 22 y.o. G81P1011 female with a history of spontaneous abortion in 2018 who presents to the MAU with complaint of watery diarrhea and one episode of vomiting today. She states that she was doing her dishes this morning when she began to experience lower abdominal cramping. The pain was located in the lower abdomen, midline and was ranked at 8/10. She had a normal BM then laid down. She was feeling better, but the cramping began again. This time the patient experienced watery diarrhea and her episode of vomiting. Denies hematochezia, melena, hematemesis. States that she did not eat breakfast today. Her whole family ate lasagna last night. No other family member is exhibiting these symptoms. Denies any recent antibiotic exposure. No sick contacts recently.  No fevers, dizziness, lightheadedness, syncope, chills, palpitations, chest pain, uncontrolled muscle tics.    LMP was January 7. Patient took home pregnancy test 5 days ago which was positive. Repeat test in MAU today is positive.    Past Medical History:  Diagnosis Date  . Medical history non-contributory   . Pregnancy induced hypertension     Past Surgical History:  Procedure Laterality Date  . NO PAST SURGERIES    . WISDOM TOOTH EXTRACTION      Family History  Problem Relation Age of Onset  . Cancer Sister 6  . Arthritis Neg Hx   . Diabetes Neg Hx   . Heart disease Neg Hx   . Hypertension Neg Hx   . Stroke Neg Hx     Social History   Tobacco Use  . Smoking status: Former Smoker    Packs/day: 0.10    Last attempt to quit: 08/27/2016    Years since quitting: 1.1  . Smokeless tobacco: Never Used  Substance Use Topics  . Alcohol use: No  . Drug use: No    Allergies: No Known Allergies  Medications Prior to Admission  Medication Sig Dispense Refill Last Dose  . Prenatal Vit-Fe Fumarate-FA (PRENATAL MULTIVITAMIN) TABS  tablet Take 1 tablet by mouth daily at 12 noon.   10/14/2017 at Unknown time    ROS Physical Exam Blood pressure 108/64, pulse 72, temperature 98.8 F (37.1 C), temperature source Oral, resp. rate 16, weight 51.5 kg (113 lb 8 oz), last menstrual period 09/05/2017, SpO2 100 %. Physical Exam  Constitutional: She is oriented to person, place, and time. She appears well-developed and well-nourished. No distress.  Eyes: Conjunctivae and EOM are normal. Pupils are equal, round, and reactive to light. No scleral icterus.  Neck: Normal range of motion.  Cardiovascular: Normal rate, regular rhythm and intact distal pulses.  Capillary refill WNL  Respiratory: Effort normal. No stridor. No respiratory distress.  GI: Soft. She exhibits no distension. There is no tenderness. There is no rebound, no tenderness at McBurney's point and negative Murphy's sign.  Musculoskeletal: She exhibits no edema or tenderness.  Neurological: She is alert and oriented to person, place, and time. Coordination normal.  Skin: Skin is warm and dry. No rash noted. She is not diaphoretic. No erythema. No pallor.  Psychiatric: She has a normal mood and affect. Her behavior is normal. Judgment and thought content normal.    MAU Course  MDM A. Given abd pain and no [place], need to r/o ectopic pregnancy        -CBC - WNL,        -hCG - 159,        -  ABO type - O positive         -abdominal/transvaginal US - revealed       "1.  Pregnancy of unknown location.       Considerations include completed spontaneous abortion, early        intrauterine pregnancy or early ectopic pregnancy.       Serial quantitative beta HCG values and follow-up ultrasound are       recommended as appropriate to document progression of and location       of pregnancy. Ectopic pregnancy has not been excluded.       2. Complex cystic mass in the left ovary measuring 3.4 centimeters       may represent a corpus luteum cyst. However, attention to the  left       ovary is recommended at the time of follow-up exam."  B. Given quantity of watery diarrhea, patient on enteric precautions       -WBC not elevated, less suspicious for infection. Given clinical scenario, unlikely to be C diff. Likely a viral etiology.   C. Urinalysis demonstrates ketonuria, hemoglobinuria and specific gravity 1.026 - patient likely dehydrated.        -Will give 2L bolus LR   Assessment/Plan   1. Abdominal Pain with vomiting + diarrhea in Pregnancy -repeat hCG in 2 days to view trend to r/o ectopic pregnancy or missed abortion, repeat US in 1 week -encourage bland PO intake today as tolerated (Bananas, Rice, Applesauce, Toast diet) -encourage water and electrolyte-rich fluids as tolerated -RTC if no improvement in symptoms over the next 48 hours or if experiencing blood in vomit or stool, or if become febrile  2. Ketonuria -encourage bland PO intake today as tolerated  -RTC with symptoms of peripheral edema, decreased urine output, changes in mental status, uncontrolled muscle movements

## 2017-10-14 NOTE — MAU Note (Addendum)
Was doing dishes, had sudden onset of abd pain.  Had watery diarhhea.  (4 stools total today, 1 solid, 1 watery, 2 clear watery). Denies fever, has thrown up one time.  Denies any recent exposures. Last pain hit her about 1300, none since.  +HPT about 5 days ago, has appt 2/25

## 2017-10-14 NOTE — MAU Provider Note (Signed)
History     CSN: [id number]  Arrival date and time: 10/14/17 1350   First Provider Initiated Contact with Patient 10/14/17 1440      Chief Complaint  Patient presents with  . Abdominal Pain  . Diarrhea   HPI   [name] is a 22 y.o. female G3P1011 @ [redacted]w[redacted]d history of spontaneous abortion in 2018 who presents to the MAU with complaint of watery diarrhea and one episode of vomiting today. She states that she was doing her dishes this morning when she began to experience lower abdominal cramping. The pain was located in the lower abdomen, midline and was ranked at 8/10. She had a normal BM then laid down. She was feeling better, but the cramping began again. This time the patient experienced watery diarrhea and her episode of vomiting. States that she did not eat breakfast today. Her whole family ate lasagna last night. No other family member is exhibiting these symptoms. Denies any recent antibiotic exposure. No sick contacts recently.   LMP was January 7. Patient took home pregnancy test 5 days ago which was positive. Repeat test in MAU today is positive.     OB History    Gravida Para Term Preterm AB Living   3 1 1  0 1 1   SAB TAB Ectopic Multiple Live Births   1 0 0 0 1      Past Medical History:  Diagnosis Date  . Medical history non-contributory   . Pregnancy induced hypertension     Past Surgical History:  Procedure Laterality Date  . NO PAST SURGERIES    . WISDOM TOOTH EXTRACTION      Family History  Problem Relation Age of Onset  . Cancer Sister 6  . Arthritis Neg Hx   . Diabetes Neg Hx   . Heart disease Neg Hx   . Hypertension Neg Hx   . Stroke Neg Hx     Social History   Tobacco Use  . Smoking status: Former Smoker    Packs/day: 0.10    Last attempt to quit: 08/27/2016    Years since quitting: 1.1  . Smokeless tobacco: Never Used  Substance Use Topics  . Alcohol use: No  . Drug use: No    Allergies: No Known Allergies  Medications  Prior to Admission  Medication Sig Dispense Refill Last Dose  . Prenatal Vit-Fe Fumarate-FA (PRENATAL MULTIVITAMIN) TABS tablet Take 1 tablet by mouth daily at 12 noon.   10/14/2017 at Unknown time   Results for orders placed or performed during the hospital encounter of 10/14/17 (from the past 48 hour(s))  Urinalysis, Routine w reflex microscopic     Status: Abnormal   Collection Time: 10/14/17  2:11 PM  Result Value Ref Range   Color, Urine YELLOW YELLOW   APPearance HAZY (A) CLEAR   Specific Gravity, Urine 1.026 1.005 - 1.030   pH 6.0 5.0 - 8.0   Glucose, UA NEGATIVE NEGATIVE mg/dL   Hgb urine dipstick SMALL (A) NEGATIVE   Bilirubin Urine NEGATIVE NEGATIVE   Ketones, ur 80 (A) NEGATIVE mg/dL   Protein, ur NEGATIVE NEGATIVE mg/dL   Nitrite NEGATIVE NEGATIVE   Leukocytes, UA NEGATIVE NEGATIVE   RBC / HPF 0-5 0 - 5 RBC/hpf   WBC, UA 0-5 0 - 5 WBC/hpf   Bacteria, UA RARE (A) NONE SEEN   Squamous Epithelial / LPF 0-5 (A) NONE SEEN   Mucus PRESENT     Comment: Performed at [facility], [address] [place], [place] [address]  Pregnancy, urine POC     Status: Abnormal   Collection Time: 10/14/17  2:23 PM  Result Value Ref Range   Preg Test, Ur POSITIVE (A) NEGATIVE    Comment:        THE SENSITIVITY OF THIS METHODOLOGY IS >24 mIU/mL   CBC     Status: None   Collection Time: 10/14/17  3:15 PM  Result Value Ref Range   WBC 8.1 4.0 - 10.5 K/uL   RBC 4.09 3.87 - 5.11 MIL/uL   Hemoglobin 12.8 12.0 - 15.0 g/dL   HCT 60.4 54.0 - 98.1 %   MCV 89.7 78.0 - 100.0 fL   MCH 31.3 26.0 - 34.0 pg   MCHC 34.9 30.0 - 36.0 g/dL   RDW 19.1 47.8 - 29.5 %   Platelets 221 150 - 400 K/uL    Comment: Performed at [facility], [address] [place], [place] [address]  ABO/Rh     Status: None   Collection Time: 10/14/17  3:15 PM  Result Value Ref Range   ABO/RH(D)      O POS Performed at [facility], [address], [place], [place] [address]   hCG, quantitative, pregnancy      Status: Abnormal   Collection Time: 10/14/17  3:15 PM  Result Value Ref Range   hCG, Beta Chain, Quant, S 159 (H) <5 mIU/mL    Comment:          GEST. AGE      CONC.  (mIU/mL)   <=1 WEEK        5 - 50     2 WEEKS       50 - 500     3 WEEKS       100 - 10,000     4 WEEKS     1,000 - 30,000     5 WEEKS     3,500 - 115,000   6-8 WEEKS     12,000 - 270,000    12 WEEKS     15,000 - 220,000        FEMALE AND NON-PREGNANT FEMALE:     LESS THAN 5 mIU/mL Performed at [facility], [address], [place], [place] [address]    US Ob Comp Less 14 Wks  Result Date: 10/14/2017 CLINICAL DATA:  Abdominal pain today. Quantitative beta HCG is 159. Positive urine pregnancy test. LMP 09/05/2016. By LMP, patient is 5 weeks 4 days. EDC by LMP is 06/12/2018. EXAM: OBSTETRIC <14 WK [place] AND TRANSVAGINAL OB US TECHNIQUE: Both transabdominal and transvaginal ultrasound examinations were performed for complete evaluation of the gestation as well as the maternal uterus, adnexal regions, and pelvic cul-de-sac. Transvaginal technique was performed to assess early pregnancy. COMPARISON:  10/27/2014 FINDINGS: Intrauterine gestational sac: None Yolk sac:  Not Visualized. Embryo:  Not Visualized. Cardiac Activity: Not Visualized. Heart Rate: Absent Subchorionic hemorrhage:  None visualized. Maternal uterus/adnexae: Small echogenic focus is identified within the endometrial canal which appears otherwise thin, measuring 1.0 centimeters. A cystic mass with thick internal septations is identified within the left ovary. Mass measures 3.4 x 2.2 x 2.8 centimeters and contains peripheral blood flow. No blood flow identified within the thick septations. Right ovary is normal in appearance. Small amount of free pelvic fluid is noted. Note is made of a prominent uterine vascularity and prominent parametrial vessels. IMPRESSION: 1.  Pregnancy of unknown location. Considerations include completed spontaneous abortion, early intrauterine  pregnancy or early ectopic pregnancy.  Serial quantitative beta HCG values and follow-up ultrasound are recommended as appropriate to document progression of and location of pregnancy. Ectopic pregnancy has not been excluded. 2. Complex cystic mass in the left ovary measuring 3.4 centimeters may represent a corpus luteum cyst. However, attention to the left ovary is recommended at the time of follow-up exam. Electronically Signed   By: Norva Pavlov M.D.   On: 10/14/2017 16:30   US Ob Transvaginal  Result Date: 10/14/2017 CLINICAL DATA:  Abdominal pain today. Quantitative beta HCG is 159. Positive urine pregnancy test. LMP 09/05/2016. By LMP, patient is 5 weeks 4 days. EDC by LMP is 06/12/2018. EXAM: OBSTETRIC <14 WK [place] AND TRANSVAGINAL OB US TECHNIQUE: Both transabdominal and transvaginal ultrasound examinations were performed for complete evaluation of the gestation as well as the maternal uterus, adnexal regions, and pelvic cul-de-sac. Transvaginal technique was performed to assess early pregnancy. COMPARISON:  10/27/2014 FINDINGS: Intrauterine gestational sac: None Yolk sac:  Not Visualized. Embryo:  Not Visualized. Cardiac Activity: Not Visualized. Heart Rate: Absent Subchorionic hemorrhage:  None visualized. Maternal uterus/adnexae: Small echogenic focus is identified within the endometrial canal which appears otherwise thin, measuring 1.0 centimeters. A cystic mass with thick internal septations is identified within the left ovary. Mass measures 3.4 x 2.2 x 2.8 centimeters and contains peripheral blood flow. No blood flow identified within the thick septations. Right ovary is normal in appearance. Small amount of free pelvic fluid is noted. Note is made of a prominent uterine vascularity and prominent parametrial vessels. IMPRESSION: 1.  Pregnancy of unknown location. Considerations include completed spontaneous abortion, early intrauterine pregnancy or early ectopic pregnancy. Serial quantitative beta  HCG values and follow-up ultrasound are recommended as appropriate to document progression of and location of pregnancy. Ectopic pregnancy has not been excluded. 2. Complex cystic mass in the left ovary measuring 3.4 centimeters may represent a corpus luteum cyst. However, attention to the left ovary is recommended at the time of follow-up exam. Electronically Signed   By: Norva Pavlov M.D.   On: 10/14/2017 16:30   Review of Systems  Constitutional: Negative for chills and fever.  Gastrointestinal: Positive for diarrhea and vomiting. Negative for abdominal pain.   Physical Exam   Blood pressure 108/64, pulse 72, temperature 98.8 F (37.1 C), temperature source Oral, resp. rate 16, weight 113 lb 8 oz (51.5 kg), last menstrual period 09/05/2017, SpO2 100 %.  Physical Exam  Constitutional: She appears well-developed and well-nourished. No distress.  HENT:  Head: Normocephalic.  Eyes: Pupils are equal, round, and reactive to light.  Neck: Neck supple.  Respiratory: Effort normal.  GI: Soft. She exhibits no distension. There is no tenderness. There is no rebound and no guarding.  Musculoskeletal: Normal range of motion.  Neurological: She is alert.  Skin: Skin is warm. She is not diaphoretic.  Psychiatric: Her behavior is normal.   MAU Course  Procedures  None  MDM  LR bolus X 1 D5LR bolus X 1 Pepcid 20 mg X 1 Reglan patient declined  Patient tolerating oral fluids  CBC, Hcg, ABO, and Transvaginal US  O positive blood type   Assessment and Plan   A:  1. Pregnancy of unknown anatomic location   2. Abdominal pain in pregnancy, antepartum   3. Gastroenteritis   4. Dehydration, severe   5. Left ovarian cyst     P:  Discharge home with strict return precautions Return to [facility] on Thursday at 0900 for stat repeat quant  BRAT diet Rx: Phenergan Increase  oral fluids Ectopic precautions Pelvic rest   Nykolas Bacallao, Harolyn RutherfordJennifer I, NP 10/14/2017 8:17 PM

## 2017-10-14 NOTE — Discharge Instructions (Signed)
Abdominal Pain During Pregnancy Abdominal pain is common in pregnancy. Most of the time, it does not cause harm. There are many causes of abdominal pain. Some causes are more serious than others and sometimes the cause is not known. Abdominal pain can be a sign that something is very wrong with the pregnancy or the pain may have nothing to do with the pregnancy. Always tell your health care provider if you have any abdominal pain. Follow these instructions at home:  Do not have sex or put anything in your vagina until your symptoms go away completely.  Watch your abdominal pain for any changes.  Get plenty of rest until your pain improves.  Drink enough fluid to keep your urine clear or pale yellow.  Take over-the-counter or prescription medicines only as told by your health care provider.  Keep all follow-up visits as told by your health care provider. This is important. Contact a health care provider if:  You have a fever.  Your pain gets worse or you have cramping.  Your pain continues after resting. Get help right away if:  You are bleeding, leaking fluid, or passing tissue from the vagina.  You have vomiting or diarrhea that does not go away.  You have painful or bloody urination.  You notice a decrease in your baby's movements.  You feel very weak or faint.  You have shortness of breath.  You develop a severe headache with abdominal pain.  You have abnormal vaginal discharge with abdominal pain. This information is not intended to replace advice given to you by your health care provider. Make sure you discuss any questions you have with your health care provider. Document Released: 08/13/2005 Document Revised: 05/24/2016 Document Reviewed: 03/12/2013 Elsevier Interactive Patient Education  2018 [employer].  Food Choices to Help Relieve Diarrhea, Adult When you have diarrhea, the foods you eat and your eating habits are very important. Choosing the right foods and  drinks can help:  Relieve diarrhea.  Replace lost fluids and nutrients.  Prevent dehydration.  What general guidelines should I follow? Relieving diarrhea  Choose foods with less than 2 g or .07 oz. of fiber per serving.  Limit fats to less than 8 tsp (38 g or 1.34 oz.) a day.  Avoid the following: ? Foods and beverages sweetened with high-fructose corn syrup, honey, or sugar alcohols such as xylitol, sorbitol, and mannitol. ? Foods that contain a lot of fat or sugar. ? Fried, greasy, or spicy foods. ? High-fiber grains, breads, and cereals. ? Raw fruits and vegetables.  Eat foods that are rich in probiotics. These foods include dairy products such as yogurt and fermented milk products. They help increase healthy bacteria in the stomach and intestines (gastrointestinal tract, or GI tract).  If you have lactose intolerance, avoid dairy products. These may make your diarrhea worse.  Take medicine to help stop diarrhea (antidiarrheal medicine) only as told by your health care provider. Replacing nutrients  Eat small meals or snacks every 3-4 hours.  Eat bland foods, such as white rice, toast, or baked potato, until your diarrhea starts to get better. Gradually reintroduce nutrient-rich foods as tolerated or as told by your health care provider. This includes: ? Well-cooked protein foods. ? Peeled, seeded, and soft-cooked fruits and vegetables. ? Low-fat dairy products.  Take vitamin and mineral supplements as told by your health care provider. Preventing dehydration   Start by sipping water or a special solution to prevent dehydration (oral rehydration solution, ORS). Urine that  is clear or pale yellow means that you are getting enough fluid.  Try to drink at least 8-10 cups of fluid each day to help replace lost fluids.  You may add other liquids in addition to water, such as clear juice or decaffeinated sports drinks, as tolerated or as told by your health care  provider.  Avoid drinks with caffeine, such as coffee, tea, or soft drinks.  Avoid alcohol. What foods are recommended? The items listed may not be a complete list. Talk with your health care provider about what dietary choices are best for you. Grains White rice. White, [place], or pita breads (fresh or toasted), including plain rolls, buns, or bagels. White pasta. Saltine, soda, or graham crackers. Pretzels. Low-fiber cereal. Cooked cereals made with water (such as cornmeal, farina, or cream cereals). Plain muffins. Matzo. Melba toast. Zwieback. Vegetables Potatoes (without the skin). Most well-cooked and canned vegetables without skins or seeds. Tender lettuce. Fruits Apple sauce. Fruits canned in juice. Cooked apricots, cherries, grapefruit, peaches, pears, or plums. Fresh bananas and cantaloupe. Meats and other protein foods Baked or boiled chicken. Eggs. Tofu. Fish. Seafood. Smooth nut butters. Ground or well-cooked tender beef, ham, veal, lamb, pork, or poultry. Dairy Plain yogurt, kefir, and unsweetened liquid yogurt. Lactose-free milk, buttermilk, skim milk, or soy milk. Low-fat or nonfat hard cheese. Beverages Water. Low-calorie sports drinks. Fruit juices without pulp. Strained tomato and vegetable juices. Decaffeinated teas. Sugar-free beverages not sweetened with sugar alcohols. Oral rehydration solutions, if approved by your health care provider. Seasoning and other foods Bouillon, broth, or soups made from recommended foods. What foods are not recommended? The items listed may not be a complete list. Talk with your health care provider about what dietary choices are best for you. Grains Whole grain, whole wheat, bran, or rye breads, rolls, pastas, and crackers. Wild or brown rice. Whole grain or bran cereals. Barley. Oats and oatmeal. Corn tortillas or taco shells. Granola. Popcorn. Vegetables Raw vegetables. Fried vegetables. Cabbage, broccoli, Brussels sprouts, artichokes,  baked beans, beet greens, corn, kale, legumes, peas, sweet potatoes, and yams. Potato skins. Cooked spinach and cabbage. Fruits Dried fruit, including raisins and dates. Raw fruits. Stewed or dried prunes. Canned fruits with syrup. Meat and other protein foods Fried or fatty meats. Deli meats. Chunky nut butters. Nuts and seeds. Beans and lentils. Tomasa BlaseBacon. Hot dogs. Sausage. Dairy High-fat cheeses. Whole milk, chocolate milk, and beverages made with milk, such as milk shakes. Half-and-half. Cream. sour cream. Ice cream. Beverages Caffeinated beverages (such as coffee, tea, soda, or energy drinks). Alcoholic beverages. Fruit juices with pulp. Prune juice. Soft drinks sweetened with high-fructose corn syrup or sugar alcohols. High-calorie sports drinks. Fats and oils Butter. Cream sauces. Margarine. Salad oils. Plain salad dressings. Olives. Avocados. Mayonnaise. Sweets and desserts Sweet rolls, doughnuts, and sweet breads. Sugar-free desserts sweetened with sugar alcohols such as xylitol and sorbitol. Seasoning and other foods Honey. Hot sauce. Chili powder. Gravy. Cream-based or milk-based soups. Pancakes and waffles. Summary  When you have diarrhea, the foods you eat and your eating habits are very important.  Make sure you get at least 8-10 cups of fluid each day, or enough to keep your urine clear or pale yellow.  Eat bland foods and gradually reintroduce healthy, nutrient-rich foods as tolerated, or as told by your health care provider.  Avoid high-fiber, fried, greasy, or spicy foods. This information is not intended to replace advice given to you by your health care provider. Make sure you discuss any questions  you have with your health care provider. Document Released: 11/03/2003 Document Revised: 08/10/2016 Document Reviewed: 08/10/2016 Elsevier Interactive Patient Education  [employer].

## 2017-10-17 ENCOUNTER — Ambulatory Visit: Payer: BLUE CROSS/BLUE SHIELD | Admitting: *Deleted

## 2017-10-17 DIAGNOSIS — O3680X0 Pregnancy with inconclusive fetal viability, not applicable or unspecified: Secondary | ICD-10-CM

## 2017-10-17 LAB — HCG, QUANTITATIVE, PREGNANCY: hCG, Beta Chain, Quant, S: 423 m[IU]/mL — ABNORMAL HIGH (ref <5)

## 2017-10-17 NOTE — Progress Notes (Signed)
Here for stat bhcg. Denies any bleeding today, c/o still having cramping pain today but less =4 today.  Explained will draw stat bhcg and have her wait in lobby for results, then will review with provider and her. She voices understanding.   Reviewed results with Vonzella NippleJulie Wenzel, PA and informed patient bhcg doubled which is an encouraging sign. Discussed would like to do us in one week and have her come for results. She agreed to 10/24/17 0900.

## 2017-10-18 NOTE — Progress Notes (Signed)
I have reviewed the chart and agree with nursing staff's documentation of this patient's encounter.  Vonzella NippleJulie Laquita Harlan, PA-C 10/18/2017 8:01 AM

## 2017-10-21 ENCOUNTER — Ambulatory Visit: Payer: Medicaid Other

## 2017-10-24 ENCOUNTER — Ambulatory Visit: Payer: BLUE CROSS/BLUE SHIELD | Admitting: General Practice

## 2017-10-24 ENCOUNTER — Ambulatory Visit (HOSPITAL_COMMUNITY)
Admission: RE | Admit: 2017-10-24 | Discharge: 2017-10-24 | Disposition: A | Discharge status: Home / Self Care | Payer: BLUE CROSS/BLUE SHIELD | Source: Ambulatory Visit | Attending: Medical | Admitting: Medical

## 2017-10-24 DIAGNOSIS — Z3A01 Less than 8 weeks gestation of pregnancy: Secondary | ICD-10-CM | POA: Diagnosis not present

## 2017-10-24 DIAGNOSIS — O3680X0 Pregnancy with inconclusive fetal viability, not applicable or unspecified: Secondary | ICD-10-CM

## 2017-10-24 DIAGNOSIS — Z3491 Encounter for supervision of normal pregnancy, unspecified, first trimester: Secondary | ICD-10-CM | POA: Insufficient documentation

## 2017-10-24 DIAGNOSIS — Z712 Person consulting for explanation of examination or test findings: Secondary | ICD-10-CM

## 2017-10-24 NOTE — Progress Notes (Signed)
Patient here for viability results today. Per Emily Frazier ultrasound shows living IUP, patient can begin prenatal care. Informed patient of results, provided dating info, & gave pictures to patient. Patient verbalized understanding to all. Patient denies taking medication, only PNV. Patient reports history of pre-e with IOL at 37 weeks with last pregnancy. Recommended she begin prenatal care. Patient verbalized understanding and had no questions

## 2017-11-05 ENCOUNTER — Telehealth: Payer: Self-pay | Admitting: *Deleted

## 2017-11-05 NOTE — Telephone Encounter (Signed)
Pt left voice mail message on 3/10 stating that she was having bad cramping and wanted to know what she should do.

## 2017-11-06 NOTE — Telephone Encounter (Signed)
Patient called and left message on nurse line stating she is [redacted] weeks pregnant and has been having body aches & wants to know what she can take. Called patient and she states she was heaving head congestion and some body aches and is thinking she is coming with a little bit of a cold. Patient states she wasn't sure what she could take. Reviewed list of approved cold/cough medications & discussed she can take tylenol as well. Patient states she bought tylenol cold/head congestion and wants to know if that is fine. Told patient that one isn't recommended as it likely has phenylephrine in it which is the ingredient we want her to avoid. Patient verbalized understanding & had no questions

## 2017-11-12 ENCOUNTER — Telehealth: Payer: Self-pay

## 2017-11-12 NOTE — Telephone Encounter (Signed)
Pt states that she is [redacted] weeks pregnant and [facility]

## 2017-11-17 NOTE — Progress Notes (Signed)
I was consulted, reviewed results and agree w/ POC.  Katrinka BlazingSmith, [place], CNM 11/17/2017 10:10 AM

## 2017-11-25 ENCOUNTER — Encounter: Payer: Self-pay | Admitting: General Practice

## 2017-11-25 NOTE — Telephone Encounter (Signed)
Called patient, no answer- unable to leave message as voicemail box was full. Will send mychart message.

## 2017-12-02 ENCOUNTER — Other Ambulatory Visit (HOSPITAL_COMMUNITY)
Admission: RE | Admit: 2017-12-02 | Discharge: 2017-12-02 | Disposition: A | Discharge status: Home / Self Care | Payer: BLUE CROSS/BLUE SHIELD | Source: Ambulatory Visit | Attending: Advanced Practice Midwife | Admitting: Advanced Practice Midwife

## 2017-12-02 ENCOUNTER — Ambulatory Visit (INDEPENDENT_AMBULATORY_CARE_PROVIDER_SITE_OTHER): Payer: BLUE CROSS/BLUE SHIELD | Admitting: Advanced Practice Midwife

## 2017-12-02 ENCOUNTER — Encounter: Payer: Self-pay | Admitting: Advanced Practice Midwife

## 2017-12-02 ENCOUNTER — Other Ambulatory Visit: Payer: Self-pay

## 2017-12-02 DIAGNOSIS — Z3480 Encounter for supervision of other normal pregnancy, unspecified trimester: Secondary | ICD-10-CM | POA: Insufficient documentation

## 2017-12-02 DIAGNOSIS — Z3481 Encounter for supervision of other normal pregnancy, first trimester: Secondary | ICD-10-CM

## 2017-12-02 DIAGNOSIS — Z3A00 Weeks of gestation of pregnancy not specified: Secondary | ICD-10-CM | POA: Diagnosis not present

## 2017-12-02 DIAGNOSIS — O09291 Supervision of pregnancy with other poor reproductive or obstetric history, first trimester: Secondary | ICD-10-CM

## 2017-12-02 DIAGNOSIS — O09299 Supervision of pregnancy with other poor reproductive or obstetric history, unspecified trimester: Secondary | ICD-10-CM

## 2017-12-02 DIAGNOSIS — Z8759 Personal history of other complications of pregnancy, childbirth and the puerperium: Secondary | ICD-10-CM | POA: Insufficient documentation

## 2017-12-02 LAB — POCT URINALYSIS DIP (DEVICE)
BILIRUBIN URINE: NEGATIVE
Glucose, UA: NEGATIVE mg/dL
Leukocytes, UA: NEGATIVE
Nitrite: NEGATIVE
Protein, ur: NEGATIVE mg/dL
Specific Gravity, Urine: >=1.03 (ref 1.005–1.030)
Urobilinogen, UA: 1 mg/dL (ref 0.0–1.0)
pH: 6 (ref 5.0–8.0)

## 2017-12-02 MED ORDER — ASPIRIN EC 81 MG PO TBEC: 81.0000 mg | DELAYED_RELEASE_TABLET | Freq: Every day | ORAL | 11 refills | Status: DC

## 2017-12-02 NOTE — Progress Notes (Signed)
  Subjective:    Emily Frazier is being seen today for her first obstetrical visit.  This is a planned pregnancy. She is at 6722w2d gestation. Her obstetrical history is significant for intrauterine growth restriction (IUGR) and pregnancy induced hypertension. Relationship with FOB: significant other, living together. Patient does intend to breast feed. Pregnancy history fully reviewed.  Patient reports no complaints.  Review of Systems:   Review of Systems  All other systems reviewed and are negative.  Objective:     LMP 09/05/2017  Physical Exam  Nursing note and vitals reviewed. Constitutional: She is oriented to person, place, and time. She appears well-developed and well-nourished. No distress.  HENT:  Head: Normocephalic.  Cardiovascular: Normal rate.  Respiratory: Effort normal.  GI: Soft. There is no tenderness.  Genitourinary:  Genitourinary Comments:  External: no lesion Vagina: small amount of white discharge Cervix: pink, smooth, no CMT Uterus: 11 weeks size    Neurological: She is alert and oriented to person, place, and time.  Skin: Skin is warm and dry.  Psychiatric: She has a normal mood and affect.    Maternal Exam:  Abdomen: Patient reports no abdominal tenderness. Fundal height is 11 weeks size .       Fetal Exam Fetal Monitor Review: Mode: hand-held doppler probe.   Baseline rate: 165.      Assessment:    Pregnancy: G3P1011 Patient Active Problem List   Diagnosis Date Noted  . Supervision of other normal pregnancy, antepartum 12/02/2017  . History of pre-eclampsia in prior pregnancy, currently pregnant 12/02/2017  . History of prior pregnancy with IUGR newborn 12/02/2017       Plan:     Initial labs drawn. Start baby ASA at 12 weeks  Pap done  Prenatal vitamins. Problem list reviewed and updated. Panoram/AFP v AFP3 discussed: requests panorma/afp, plan for AFP at next visit . Role of ultrasound in pregnancy discussed; fetal  survey: requested. Amniocentesis discussed: not indicated. Follow up in 4 weeks. 50% of 45 min visit spent on counseling and coordination of care.     Thressa ShellerHeather Nkechi Linehan 12/02/2017

## 2017-12-04 LAB — CYTOLOGY - PAP
BACTERIAL VAGINITIS: NEGATIVE
CANDIDA VAGINITIS: NEGATIVE
Chlamydia: NEGATIVE
DIAGNOSIS: NEGATIVE
NEISSERIA GONORRHEA: NEGATIVE
Trichomonas: NEGATIVE

## 2017-12-06 ENCOUNTER — Encounter: Payer: Self-pay | Admitting: Advanced Practice Midwife

## 2017-12-09 LAB — SMN1 COPY NUMBER ANALYSIS (SMA CARRIER SCREENING)

## 2017-12-10 ENCOUNTER — Encounter: Payer: Self-pay | Admitting: Advanced Practice Midwife

## 2017-12-10 LAB — COMPREHENSIVE METABOLIC PANEL
ALT: 11 IU/L (ref 0–32)
AST: 15 IU/L (ref 0–40)
Albumin/Globulin Ratio: 1.6 (ref 1.2–2.2)
Albumin: 4.3 g/dL (ref 3.5–5.5)
Alkaline Phosphatase: 55 IU/L (ref 39–117)
BILIRUBIN TOTAL: 0.3 mg/dL (ref 0.0–1.2)
BUN/Creatinine Ratio: 12 (ref 9–23)
BUN: 7 mg/dL (ref 6–20)
CHLORIDE: 100 mmol/L (ref 96–106)
CO2: 23 mmol/L (ref 20–29)
Calcium: 9.6 mg/dL (ref 8.7–10.2)
Creatinine, Ser: 0.57 mg/dL (ref 0.57–1.00)
GFR, EST AFRICAN AMERICAN: 151 mL/min/{1.73_m2} (ref >59)
GFR, EST NON AFRICAN AMERICAN: 131 mL/min/{1.73_m2} (ref >59)
GLOBULIN, TOTAL: 2.7 g/dL (ref 1.5–4.5)
Glucose: 73 mg/dL (ref 65–99)
Potassium: 4.6 mmol/L (ref 3.5–5.2)
SODIUM: 136 mmol/L (ref 134–144)
Total Protein: 7 g/dL (ref 6.0–8.5)

## 2017-12-10 LAB — OBSTETRIC PANEL, INCLUDING HIV
ANTIBODY SCREEN: NEGATIVE
BASOS: 0 %
Basophils Absolute: 0 10*3/uL (ref 0.0–0.2)
EOS (ABSOLUTE): 0.2 10*3/uL (ref 0.0–0.4)
EOS: 2 %
HEMATOCRIT: 37.3 % (ref 34.0–46.6)
HEMOGLOBIN: 12.6 g/dL (ref 11.1–15.9)
HEP B S AG: NEGATIVE
HIV SCREEN 4TH GENERATION: NONREACTIVE
Immature Grans (Abs): 0 10*3/uL (ref 0.0–0.1)
Immature Granulocytes: 0 %
LYMPHS ABS: 2 10*3/uL (ref 0.7–3.1)
Lymphs: 21 %
MCH: 30.5 pg (ref 26.6–33.0)
MCHC: 33.8 g/dL (ref 31.5–35.7)
MCV: 90 fL (ref 79–97)
MONOS ABS: 0.9 10*3/uL (ref 0.1–0.9)
Monocytes: 10 %
NEUTROS ABS: 6.5 10*3/uL (ref 1.4–7.0)
Neutrophils: 67 %
Platelets: 278 10*3/uL (ref 150–379)
RBC: 4.13 x10E6/uL (ref 3.77–5.28)
RDW: 12.9 % (ref 12.3–15.4)
RH TYPE: POSITIVE
RPR: NONREACTIVE
Rubella Antibodies, IGG: 5.59 index (ref >0.99)
WBC: 9.7 10*3/uL (ref 3.4–10.8)

## 2017-12-10 LAB — PROTEIN / CREATININE RATIO, URINE
Creatinine, Urine: 191.7 mg/dL
PROTEIN UR: 18.2 mg/dL
PROTEIN/CREAT RATIO: 95 mg/g{creat} (ref 0–200)

## 2017-12-10 LAB — CYSTIC FIBROSIS MUTATION 97: Interpretation: NOT DETECTED

## 2017-12-12 ENCOUNTER — Encounter: Payer: Self-pay | Admitting: Advanced Practice Midwife

## 2017-12-23 ENCOUNTER — Encounter: Payer: Self-pay | Admitting: Advanced Practice Midwife

## 2017-12-23 ENCOUNTER — Ambulatory Visit (INDEPENDENT_AMBULATORY_CARE_PROVIDER_SITE_OTHER): Payer: BLUE CROSS/BLUE SHIELD | Admitting: Advanced Practice Midwife

## 2017-12-23 VITALS — BP 108/60 | HR 85 | Wt 124.0 lb

## 2017-12-23 DIAGNOSIS — Z3480 Encounter for supervision of other normal pregnancy, unspecified trimester: Secondary | ICD-10-CM

## 2017-12-23 DIAGNOSIS — Z3482 Encounter for supervision of other normal pregnancy, second trimester: Secondary | ICD-10-CM

## 2017-12-23 NOTE — Progress Notes (Signed)
   PRENATAL VISIT NOTE  Subjective:  Emily Frazier is a 22 y.o. G3P1011 at [redacted]w[redacted]d being seen today for ongoing prenatal care.  She is currently monitored for the following issues for this low-risk pregnancy and has Supervision of other normal pregnancy, antepartum; History of pre-eclampsia in prior pregnancy, currently pregnant; and History of prior pregnancy with IUGR newborn on their problem list.  Patient reports no complaints.  Contractions: Not present. Vag. Bleeding: None.   . Denies leaking of fluid.   The following portions of the patient's history were reviewed and updated as appropriate: allergies, current medications, past family history, past medical history, past social history, past surgical history and problem list. Problem list updated.  Objective:   Vitals:   12/23/17 1443  BP: 108/60  Pulse: 85  Weight: 124 lb (56.2 kg)    Fetal Status: Fetal Heart Rate (bpm): 145 Fundal Height: 14 cm       General:  Alert, oriented and cooperative. Patient is in no acute distress.  Skin: Skin is warm and dry. No rash noted.   Cardiovascular: Normal heart rate noted  Respiratory: Normal respiratory effort, no problems with respiration noted  Abdomen: Soft, gravid, appropriate for gestational age.  Pain/Pressure: Absent     Pelvic: Cervical exam deferred        Extremities: Normal range of motion.  Edema: None  Mental Status: Normal mood and affect. Normal behavior. Normal judgment and thought content.   Assessment and Plan:  Pregnancy: G3P1011 at [redacted]w[redacted]d  1. Supervision of other normal pregnancy, antepartum - [place] MFM OB DETAIL +14 WK; Future - AFP/Panorama today  - Taking baby ASA  Preterm labor symptoms and general obstetric precautions including but not limited to vaginal bleeding, contractions, leaking of fluid and fetal movement were reviewed in detail with the patient. Please refer to After Visit Summary for other counseling recommendations.  Return in about 1 month  (around 01/20/2018).  Future Appointments  Date Time Provider Department Center  01/23/2018  3:00 PM [facility] [place] 3 [facility]    Thressa Sheller, CNM

## 2017-12-31 ENCOUNTER — Encounter: Payer: Self-pay | Admitting: Advanced Practice Midwife

## 2018-01-01 ENCOUNTER — Encounter: Payer: Self-pay | Admitting: *Deleted

## 2018-01-02 ENCOUNTER — Telehealth: Payer: Self-pay | Admitting: *Deleted

## 2018-01-02 LAB — AFP, SERUM, OPEN SPINA BIFIDA
AFP Value: 23.9 ng/mL
GEST. AGE ON COLLECTION DATE: 14.2 wk
Maternal Age At EDD: 24.2 yr
Weight: 124 [lb_av]

## 2018-01-02 NOTE — Telephone Encounter (Signed)
Received a voicemail from [facility] re: afp drawn - states shows was drawn at gestational age of [redacted] weeks which is too early.  Need to verify gestational age. I called and verified was drawn too early- will make note needs to be redrawn.  And will send message to provider.

## 2018-01-22 ENCOUNTER — Ambulatory Visit (INDEPENDENT_AMBULATORY_CARE_PROVIDER_SITE_OTHER): Payer: BLUE CROSS/BLUE SHIELD | Admitting: Advanced Practice Midwife

## 2018-01-22 ENCOUNTER — Encounter: Payer: Self-pay | Admitting: Advanced Practice Midwife

## 2018-01-22 VITALS — BP 107/54 | HR 79 | Wt 132.0 lb

## 2018-01-22 DIAGNOSIS — Z029 Encounter for administrative examinations, unspecified: Secondary | ICD-10-CM

## 2018-01-22 DIAGNOSIS — Z3480 Encounter for supervision of other normal pregnancy, unspecified trimester: Secondary | ICD-10-CM

## 2018-01-22 NOTE — Progress Notes (Signed)
   PRENATAL VISIT NOTE  Subjective:  Emily Frazier is a 22 y.o. G3P1011 at [redacted]w[redacted]d being seen today for ongoing prenatal care.  She is currently monitored for the following issues for this low-risk pregnancy and has Supervision of other normal pregnancy, antepartum; History of pre-eclampsia in prior pregnancy, currently pregnant; and History of prior pregnancy with IUGR newborn on their problem list.  Patient reports no complaints.  Contractions: Not present. Vag. Bleeding: None.  Movement: Present. Denies leaking of fluid.   The following portions of the patient's history were reviewed and updated as appropriate: allergies, current medications, past family history, past medical history, past social history, past surgical history and problem list. Problem list updated.  Objective:   Vitals:   01/22/18 1448  BP: (!) 107/54  Pulse: 79  Weight: 132 lb (59.9 kg)    Fetal Status: Fetal Heart Rate (bpm): 142 Fundal Height: 18 cm Movement: Present     General:  Alert, oriented and cooperative. Patient is in no acute distress.  Skin: Skin is warm and dry. No rash noted.   Cardiovascular: Normal heart rate noted  Respiratory: Normal respiratory effort, no problems with respiration noted  Abdomen: Soft, gravid, appropriate for gestational age.  Pain/Pressure: Absent     Pelvic: Cervical exam deferred        Extremities: Normal range of motion.  Edema: None  Mental Status: Normal mood and affect. Normal behavior. Normal judgment and thought content.   Assessment and Plan:  Pregnancy: G3P1011 at [redacted]w[redacted]d  1. Supervision of other normal pregnancy, antepartum - AFP only, redrawn today  - [place] scheduled for tomorrow   Preterm labor symptoms and general obstetric precautions including but not limited to vaginal bleeding, contractions, leaking of fluid and fetal movement were reviewed in detail with the patient. Please refer to After Visit Summary for other counseling recommendations.  Return in  about 1 month (around 02/19/2018).  Future Appointments  Date Time Provider Department Center  01/23/2018  3:00 PM [facility] [place] 3 [facility]    Thressa Sheller, CNM

## 2018-01-23 ENCOUNTER — Ambulatory Visit (HOSPITAL_COMMUNITY)
Admission: RE | Admit: 2018-01-23 | Discharge: 2018-01-23 | Disposition: A | Discharge status: Home / Self Care | Payer: BLUE CROSS/BLUE SHIELD | Source: Ambulatory Visit | Attending: Advanced Practice Midwife | Admitting: Advanced Practice Midwife

## 2018-01-23 DIAGNOSIS — Z3480 Encounter for supervision of other normal pregnancy, unspecified trimester: Secondary | ICD-10-CM | POA: Insufficient documentation

## 2018-01-24 LAB — AFP, SERUM, OPEN SPINA BIFIDA
AFP MoM: 0.68
AFP VALUE AFPOSL: 31.2 ng/mL
Gest. Age on Collection Date: 18 weeks
MATERNAL AGE AT EDD: 24.2 a
OSBR Risk 1 IN: 10000
TEST RESULTS AFP: NEGATIVE
Weight: 132 [lb_av]

## 2018-02-17 ENCOUNTER — Encounter: Payer: Self-pay | Admitting: Advanced Practice Midwife

## 2018-02-17 ENCOUNTER — Ambulatory Visit (INDEPENDENT_AMBULATORY_CARE_PROVIDER_SITE_OTHER): Payer: BLUE CROSS/BLUE SHIELD | Admitting: Advanced Practice Midwife

## 2018-02-17 VITALS — BP 99/57 | HR 80 | Wt 135.5 lb

## 2018-02-17 DIAGNOSIS — Z3482 Encounter for supervision of other normal pregnancy, second trimester: Secondary | ICD-10-CM

## 2018-02-17 DIAGNOSIS — Z3480 Encounter for supervision of other normal pregnancy, unspecified trimester: Secondary | ICD-10-CM

## 2018-02-17 NOTE — Progress Notes (Signed)
   PRENATAL VISIT NOTE  Subjective:  Emily Frazier is a 22 y.o. G3P1011 at 8425w2d being seen today for ongoing prenatal care.  She is currently monitored for the following issues for this low-risk pregnancy and has Supervision of other normal pregnancy, antepartum; History of pre-eclampsia in prior pregnancy, currently pregnant; and History of prior pregnancy with IUGR newborn on their problem list.  Patient reports no complaints.  Contractions: Not present. Vag. Bleeding: None.  Movement: Present. Denies leaking of fluid.   The following portions of the patient's history were reviewed and updated as appropriate: allergies, current medications, past family history, past medical history, past social history, past surgical history and problem list. Problem list updated.  Objective:   Vitals:   02/17/18 1549  BP: (!) 99/57  Pulse: 80  Weight: 135 lb 8 oz (61.5 kg)    Fetal Status: Fetal Heart Rate (bpm): 147 Fundal Height: 22 cm Movement: Present     General:  Alert, oriented and cooperative. Patient is in no acute distress.  Skin: Skin is warm and dry. No rash noted.   Cardiovascular: Normal heart rate noted  Respiratory: Normal respiratory effort, no problems with respiration noted  Abdomen: Soft, gravid, appropriate for gestational age.  Pain/Pressure: Absent     Pelvic: Cervical exam deferred        Extremities: Normal range of motion.  Edema: None  Mental Status: Normal mood and affect. Normal behavior. Normal judgment and thought content.   Assessment and Plan:  Pregnancy: G3P1011 at 5325w2d  1. Supervision of other normal pregnancy, antepartum - US MFM OB FOLLOW UP; Future - Routine care - GTT at next visit   Preterm labor symptoms and general obstetric precautions including but not limited to vaginal bleeding, contractions, leaking of fluid and fetal movement were reviewed in detail with the patient. Please refer to After Visit Summary for other counseling recommendations.   Return in about 1 month (around 03/17/2018).  Future Appointments  Date Time Provider Department Center  02/24/2018  3:00 PM [facility] [place] 3 [facility]    Thressa ShellerHeather Sherre Wooton, CNM

## 2018-02-17 NOTE — Patient Instructions (Signed)

## 2018-02-24 ENCOUNTER — Ambulatory Visit (HOSPITAL_COMMUNITY): Payer: Medicaid Other

## 2018-03-04 ENCOUNTER — Other Ambulatory Visit: Payer: Self-pay | Admitting: Advanced Practice Midwife

## 2018-03-04 ENCOUNTER — Ambulatory Visit (HOSPITAL_COMMUNITY)
Admission: RE | Admit: 2018-03-04 | Discharge: 2018-03-04 | Disposition: A | Discharge status: Home / Self Care | Payer: BLUE CROSS/BLUE SHIELD | Source: Ambulatory Visit | Attending: Advanced Practice Midwife | Admitting: Advanced Practice Midwife

## 2018-03-04 DIAGNOSIS — Z3480 Encounter for supervision of other normal pregnancy, unspecified trimester: Secondary | ICD-10-CM

## 2018-03-04 DIAGNOSIS — Z3A24 24 weeks gestation of pregnancy: Secondary | ICD-10-CM

## 2018-03-04 DIAGNOSIS — Z362 Encounter for other antenatal screening follow-up: Secondary | ICD-10-CM | POA: Insufficient documentation

## 2018-03-04 DIAGNOSIS — IMO0002 Reserved for concepts with insufficient information to code with codable children: Secondary | ICD-10-CM

## 2018-03-04 DIAGNOSIS — Z0489 Encounter for examination and observation for other specified reasons: Secondary | ICD-10-CM

## 2018-03-04 DIAGNOSIS — O09292 Supervision of pregnancy with other poor reproductive or obstetric history, second trimester: Secondary | ICD-10-CM | POA: Diagnosis not present

## 2018-03-19 ENCOUNTER — Other Ambulatory Visit: Payer: Medicaid Other

## 2018-03-19 ENCOUNTER — Encounter: Payer: Medicaid Other | Admitting: Student

## 2018-03-23 ENCOUNTER — Encounter: Payer: Self-pay | Admitting: Student

## 2018-03-24 ENCOUNTER — Other Ambulatory Visit: Payer: Self-pay

## 2018-03-24 ENCOUNTER — Other Ambulatory Visit: Payer: Self-pay | Admitting: Internal Medicine

## 2018-03-24 ENCOUNTER — Other Ambulatory Visit: Payer: Medicaid Other

## 2018-03-24 ENCOUNTER — Ambulatory Visit (INDEPENDENT_AMBULATORY_CARE_PROVIDER_SITE_OTHER): Payer: BLUE CROSS/BLUE SHIELD | Admitting: Student

## 2018-03-24 VITALS — BP 108/56 | HR 74 | Wt 140.0 lb

## 2018-03-24 DIAGNOSIS — Z3480 Encounter for supervision of other normal pregnancy, unspecified trimester: Secondary | ICD-10-CM

## 2018-03-24 DIAGNOSIS — O09299 Supervision of pregnancy with other poor reproductive or obstetric history, unspecified trimester: Secondary | ICD-10-CM

## 2018-03-24 DIAGNOSIS — Z23 Encounter for immunization: Secondary | ICD-10-CM

## 2018-03-24 DIAGNOSIS — O09292 Supervision of pregnancy with other poor reproductive or obstetric history, second trimester: Secondary | ICD-10-CM

## 2018-03-24 NOTE — Progress Notes (Signed)
   PRENATAL VISIT NOTE  Subjective:  Emily Frazier is a 22 y.o. G3P1011 at 7272w2d being seen today for ongoing prenatal care.  She is currently monitored for the following issues for this low-risk pregnancy and has Supervision of other normal pregnancy, antepartum; History of pre-eclampsia in prior pregnancy, currently pregnant; and History of prior pregnancy with IUGR newborn on their problem list.  Patient reports no complaints.  Contractions: Not present. Vag. Bleeding: None.  Movement: Present. Denies leaking of fluid.   The following portions of the patient's history were reviewed and updated as appropriate: allergies, current medications, past family history, past medical history, past social history, past surgical history and problem list. Problem list updated.  Objective:   Vitals:   03/24/18 1126  BP: (!) 108/56  Pulse: 74  Weight: 140 lb (63.5 kg)    Fetal Status: Fetal Heart Rate (bpm): 145   Movement: Present, Fundal Height 27 cm      General:  Alert, oriented and cooperative. Patient is in no acute distress.  Skin: Skin is warm and dry. No rash noted.   Cardiovascular: Normal heart rate noted  Respiratory: Normal respiratory effort, no problems with respiration noted  Abdomen: Soft, gravid, appropriate for gestational age.  Pain/Pressure: Absent     Pelvic: Cervical exam deferred        Extremities: Normal range of motion.  Edema: None  Mental Status: Normal mood and affect. Normal behavior. Normal judgment and thought content.   Assessment and Plan:  Pregnancy: G3P1011 at 4772w2d  1. Supervision of other normal pregnancy, antepartum -CBC, HIV, RPR and 2hr GTT today  -Tdap given today   2. History of pre-eclampsia in prior pregnancy, currently pregnant Patient asymptomatic. Taking baby ASA daily.    Preterm labor symptoms and general obstetric precautions including but not limited to vaginal bleeding, contractions, leaking of fluid and fetal movement were  reviewed in detail with the patient. Please refer to After Visit Summary for other counseling recommendations.  Return in about 2 weeks (around 04/07/2018) for routine PNC.  Marcy Sirenatherine Verlisa Vara, D.O. OB Fellow  03/24/2018, 11:43 AM

## 2018-03-24 NOTE — Patient Instructions (Signed)

## 2018-03-25 LAB — CBC
HEMATOCRIT: 29.6 % — AB (ref 34.0–46.6)
HEMOGLOBIN: 10 g/dL — AB (ref 11.1–15.9)
MCH: 31.9 pg (ref 26.6–33.0)
MCHC: 33.8 g/dL (ref 31.5–35.7)
MCV: 95 fL (ref 79–97)
Platelets: 215 10*3/uL (ref 150–450)
RBC: 3.13 x10E6/uL — AB (ref 3.77–5.28)
RDW: 12.2 % — AB (ref 12.3–15.4)
WBC: 4.7 10*3/uL (ref 3.4–10.8)

## 2018-03-25 LAB — GLUCOSE TOLERANCE, 2 HOURS W/ 1HR
GLUCOSE, 2 HOUR: 64 mg/dL — AB (ref 65–152)
Glucose, 1 hour: 125 mg/dL (ref 65–179)
Glucose, Fasting: 79 mg/dL (ref 65–91)

## 2018-03-25 LAB — RPR: RPR Ser Ql: NONREACTIVE

## 2018-03-25 LAB — HIV ANTIBODY (ROUTINE TESTING W REFLEX): HIV Screen 4th Generation wRfx: NONREACTIVE

## 2018-04-08 ENCOUNTER — Ambulatory Visit (INDEPENDENT_AMBULATORY_CARE_PROVIDER_SITE_OTHER): Payer: BLUE CROSS/BLUE SHIELD | Admitting: Student

## 2018-04-08 VITALS — BP 110/55 | HR 78 | Wt 141.9 lb

## 2018-04-08 DIAGNOSIS — O26843 Uterine size-date discrepancy, third trimester: Secondary | ICD-10-CM | POA: Insufficient documentation

## 2018-04-08 DIAGNOSIS — Z3483 Encounter for supervision of other normal pregnancy, third trimester: Secondary | ICD-10-CM

## 2018-04-08 DIAGNOSIS — Z3480 Encounter for supervision of other normal pregnancy, unspecified trimester: Secondary | ICD-10-CM

## 2018-04-08 NOTE — Progress Notes (Signed)
   PRENATAL VISIT NOTE  Subjective:  Emily Frazier is a 22 y.o. G3P1011 at 2857w3d being seen today for ongoing prenatal care.  She is currently monitored for the following issues for this low-risk pregnancy and has Supervision of other normal pregnancy, antepartum; History of pre-eclampsia in prior pregnancy, currently pregnant; History of prior pregnancy with IUGR newborn; and Uterine size date discrepancy pregnancy, third trimester on their problem list.  Patient reports vaginal discharge since last week. Occasional trickly of watery fluid. Not consistent. No odor or irritation. .  Contractions: Not present. Vag. Bleeding: None.  Movement: Present. Denies leaking of fluid.   The following portions of the patient's history were reviewed and updated as appropriate: allergies, current medications, past family history, past medical history, past social history, past surgical history and problem list. Problem list updated.  Objective:   Vitals:   04/08/18 1035  BP: (!) 110/55  Pulse: 78  Weight: 141 lb 14.4 oz (64.4 kg)    Fetal Status: Fetal Heart Rate (bpm): 141 Fundal Height: 26 cm Movement: Present     General:  Alert, oriented and cooperative. Patient is in no acute distress.  Skin: Skin is warm and dry. No rash noted.   Cardiovascular: Normal heart rate noted  Respiratory: Normal respiratory effort, no problems with respiration noted  Abdomen: Soft, gravid, appropriate for gestational age.  Pain/Pressure: Absent     Pelvic: Cervical exam deferred        SSE performed, no pooling of fluid  Extremities: Normal range of motion.  Edema: None  Mental Status: Normal mood and affect. Normal behavior. Normal judgment and thought content.   Assessment and Plan:  Pregnancy: G3P1011 at 3357w3d  1. Supervision of other normal pregnancy, antepartum -SSE for discharge, no pooling of fluid & negative fern slide  2. Uterine size date discrepancy pregnancy, third trimester -will recheck next  visit to consider f/u growth. U/s 7/1 had EFW 73%. Pt with hx of IUGR baby.   Preterm labor symptoms and general obstetric precautions including but not limited to vaginal bleeding, contractions, leaking of fluid and fetal movement were reviewed in detail with the patient. Please refer to After Visit Summary for other counseling recommendations.  Return in about 20 days (around 04/28/2018) for Routine OB.  No future appointments.  Judeth HornErin Tzirel Leonor, NP

## 2018-04-08 NOTE — Patient Instructions (Signed)
Research childbirth classes and hospital preregistration at [email or web address]  Fetal Movement Counts Patient Name: ________________________________________________ Patient Due Date: ____________________ What is a fetal movement count? A fetal movement count is the number of times that you feel your baby move during a certain amount of time. This may also be called a fetal kick count. A fetal movement count is recommended for every pregnant woman. You may be asked to start counting fetal movements as early as week 28 of your pregnancy. Pay attention to when your baby is most active. You may notice your baby's sleep and wake cycles. You may also notice things that make your baby move more. You should do a fetal movement count:  When your baby is normally most active.  At the same time each day.  A good time to count movements is while you are resting, after having something to eat and drink. How do I count fetal movements? 1. Find a quiet, comfortable area. Sit, or lie down on your side. 2. Write down the date, the start time and stop time, and the number of movements that you felt between those two times. Take this information with you to your health care visits. 3. For 2 hours, count kicks, flutters, swishes, rolls, and jabs. You should feel at least 10 movements during 2 hours. 4. You may stop counting after you have felt 10 movements. 5. If you do not feel 10 movements in 2 hours, have something to eat and drink. Then, keep resting and counting for 1 hour. If you feel at least 4 movements during that hour, you may stop counting. Contact a health care provider if:  You feel fewer than 4 movements in 2 hours.  Your baby is not moving like he or she usually does. Date: ____________ Start time: ____________ Stop time: ____________ Movements: ____________ Date: ____________ Start time: ____________ Stop time: ____________ Movements: ____________ Date: ____________ Start time: ____________  Stop time: ____________ Movements: ____________ Date: ____________ Start time: ____________ Stop time: ____________ Movements: ____________ Date: ____________ Start time: ____________ Stop time: ____________ Movements: ____________ Date: ____________ Start time: ____________ Stop time: ____________ Movements: ____________ Date: ____________ Start time: ____________ Stop time: ____________ Movements: ____________ Date: ____________ Start time: ____________ Stop time: ____________ Movements: ____________ Date: ____________ Start time: ____________ Stop time: ____________ Movements: ____________ This information is not intended to replace advice given to you by your health care provider. Make sure you discuss any questions you have with your health care provider. Document Released: 09/12/2006 Document Revised: 04/11/2016 Document Reviewed: 09/22/2015 Elsevier Interactive Patient Education  2018 [employer]  Braxton Hicks Contractions Contractions of the uterus can occur throughout pregnancy, but they are not always a sign that you are in labor. You may have practice contractions called Braxton Hicks contractions. These false labor contractions are sometimes confused with true labor. What are Braxton Hicks contractions? Braxton Hicks contractions are tightening movements that occur in the muscles of the uterus before labor. Unlike true labor contractions, these contractions do not result in opening (dilation) and thinning of the cervix. Toward the end of pregnancy (32-34 weeks), Braxton Hicks contractions can happen more often and may become stronger. These contractions are sometimes difficult to tell apart from true labor because they can be very uncomfortable. You should not feel embarrassed if you go to the hospital with false labor. Sometimes, the only way to tell if you are in true labor is for your health care provider to look for changes in the cervix. The health care provider will   do a physical  exam and may monitor your contractions. If you are not in true labor, the exam should show that your cervix is not dilating and your water has not broken. If there are other health problems associated with your pregnancy, it is completely safe for you to be sent home with false labor. You may continue to have Braxton Hicks contractions until you go into true labor. How to tell the difference between true labor and false labor True labor  Contractions last 30-70 seconds.  Contractions become very regular.  Discomfort is usually felt in the top of the uterus, and it spreads to the lower abdomen and low back.  Contractions do not go away with walking.  Contractions usually become more intense and increase in frequency.  The cervix dilates and gets thinner. False labor  Contractions are usually shorter and not as strong as true labor contractions.  Contractions are usually irregular.  Contractions are often felt in the front of the lower abdomen and in the groin.  Contractions may go away when you walk around or change positions while lying down.  Contractions get weaker and are shorter-lasting as time goes on.  The cervix usually does not dilate or become thin. Follow these instructions at home:  Take over-the-counter and prescription medicines only as told by your health care provider.  Keep up with your usual exercises and follow other instructions from your health care provider.  Eat and drink lightly if you think you are going into labor.  If Braxton Hicks contractions are making you uncomfortable: ? Change your position from lying down or resting to walking, or change from walking to resting. ? Sit and rest in a tub of warm water. ? Drink enough fluid to keep your urine pale yellow. Dehydration may cause these contractions. ? Do slow and deep breathing several times an hour.  Keep all follow-up prenatal visits as told by your health care provider. This is  important. Contact a health care provider if:  You have a fever.  You have continuous pain in your abdomen. Get help right away if:  Your contractions become stronger, more regular, and closer together.  You have fluid leaking or gushing from your vagina.  You pass blood-tinged mucus (bloody show).  You have bleeding from your vagina.  You have low back pain that you never had before.  You feel your baby's head pushing down and causing pelvic pressure.  Your baby is not moving inside you as much as it used to. Summary  Contractions that occur before labor are called Braxton Hicks contractions, false labor, or practice contractions.  Braxton Hicks contractions are usually shorter, weaker, farther apart, and less regular than true labor contractions. True labor contractions usually become progressively stronger and regular and they become more frequent.  Manage discomfort from Braxton Hicks contractions by changing position, resting in a warm bath, drinking plenty of water, or practicing deep breathing. This information is not intended to replace advice given to you by your health care provider. Make sure you discuss any questions you have with your health care provider. Document Released: 12/27/2016 Document Revised: 12/27/2016 Document Reviewed: 12/27/2016 Elsevier Interactive Patient Education  2018 Elsevier Inc.    

## 2018-04-30 ENCOUNTER — Ambulatory Visit (INDEPENDENT_AMBULATORY_CARE_PROVIDER_SITE_OTHER): Payer: BLUE CROSS/BLUE SHIELD | Admitting: Advanced Practice Midwife

## 2018-04-30 VITALS — BP 116/67 | HR 89 | Wt 144.4 lb

## 2018-04-30 DIAGNOSIS — Z3483 Encounter for supervision of other normal pregnancy, third trimester: Secondary | ICD-10-CM

## 2018-04-30 DIAGNOSIS — O26843 Uterine size-date discrepancy, third trimester: Secondary | ICD-10-CM

## 2018-04-30 DIAGNOSIS — O09293 Supervision of pregnancy with other poor reproductive or obstetric history, third trimester: Secondary | ICD-10-CM

## 2018-04-30 NOTE — Progress Notes (Signed)
   PRENATAL VISIT NOTE  Subjective:  Emily Frazier is a 22 y.o. G3P1011 at [redacted]w[redacted]d being seen today for ongoing prenatal care.  She is currently monitored for the following issues for this low-risk pregnancy and has Supervision of other normal pregnancy, antepartum; History of pre-eclampsia in prior pregnancy, currently pregnant; History of prior pregnancy with IUGR newborn; and Uterine size date discrepancy pregnancy, third trimester on their problem list.  Patient reports no complaints.  Contractions: Not present. Vag. Bleeding: None.  Movement: Present. Denies leaking of fluid.   The following portions of the patient's history were reviewed and updated as appropriate: allergies, current medications, past family history, past medical history, past social history, past surgical history and problem list. Problem list updated.  Objective:   Vitals:   04/30/18 1611  BP: 116/67  Pulse: 89  Weight: 144 lb 6.4 oz (65.5 kg)    Fetal Status: Fetal Heart Rate (bpm): 152 Fundal Height: 32 cm Movement: Present     General:  Alert, oriented and cooperative. Patient is in no acute distress.  Skin: Skin is warm and dry. No rash noted.   Cardiovascular: Normal heart rate noted  Respiratory: Normal respiratory effort, no problems with respiration noted  Abdomen: Soft, gravid, appropriate for gestational age.  Pain/Pressure: Absent     Pelvic: Cervical exam deferred        Extremities: Normal range of motion.  Edema: Trace  Mental Status: Normal mood and affect. Normal behavior. Normal judgment and thought content.   Assessment and Plan:  Pregnancy: G3P1011 at [redacted]w[redacted]d  1. Encounter for supervision of other normal pregnancy in third trimester - no complaints or concerns, continue routine care  2. Uterine size-date discrepancy in third trimester - fundal height consistent with dates today  3. Hx of preeclampsia, prior pregnancy, currently pregnant, third trimester - Taking Aspirin  81mg   Preterm labor symptoms and general obstetric precautions including but not limited to vaginal bleeding, contractions, leaking of fluid and fetal movement were reviewed in detail with the patient. Please refer to After Visit Summary for other counseling recommendations.  Return in about 2 weeks (around 05/14/2018).  No future appointments.  Calvert Cantor, CNM 04/30/18  4:19 PM

## 2018-04-30 NOTE — Patient Instructions (Signed)
Research childbirth classes and hospital preregistration at [email or web address]  Fetal Movement Counts Patient Name: ________________________________________________ Patient Due Date: ____________________ What is a fetal movement count? A fetal movement count is the number of times that you feel your baby move during a certain amount of time. This may also be called a fetal kick count. A fetal movement count is recommended for every pregnant woman. You may be asked to start counting fetal movements as early as week 28 of your pregnancy. Pay attention to when your baby is most active. You may notice your baby's sleep and wake cycles. You may also notice things that make your baby move more. You should do a fetal movement count:  When your baby is normally most active.  At the same time each day.  A good time to count movements is while you are resting, after having something to eat and drink. How do I count fetal movements? 1. Find a quiet, comfortable area. Sit, or lie down on your side. 2. Write down the date, the start time and stop time, and the number of movements that you felt between those two times. Take this information with you to your health care visits. 3. For 2 hours, count kicks, flutters, swishes, rolls, and jabs. You should feel at least 10 movements during 2 hours. 4. You may stop counting after you have felt 10 movements. 5. If you do not feel 10 movements in 2 hours, have something to eat and drink. Then, keep resting and counting for 1 hour. If you feel at least 4 movements during that hour, you may stop counting. Contact a health care provider if:  You feel fewer than 4 movements in 2 hours.  Your baby is not moving like he or she usually does. Date: ____________ Start time: ____________ Stop time: ____________ Movements: ____________ Date: ____________ Start time: ____________ Stop time: ____________ Movements: ____________ Date: ____________ Start time: ____________  Stop time: ____________ Movements: ____________ Date: ____________ Start time: ____________ Stop time: ____________ Movements: ____________ Date: ____________ Start time: ____________ Stop time: ____________ Movements: ____________ Date: ____________ Start time: ____________ Stop time: ____________ Movements: ____________ Date: ____________ Start time: ____________ Stop time: ____________ Movements: ____________ Date: ____________ Start time: ____________ Stop time: ____________ Movements: ____________ Date: ____________ Start time: ____________ Stop time: ____________ Movements: ____________ This information is not intended to replace advice given to you by your health care provider. Make sure you discuss any questions you have with your health care provider. Document Released: 09/12/2006 Document Revised: 04/11/2016 Document Reviewed: 09/22/2015 Elsevier Interactive Patient Education  2018 [employer]  Braxton Hicks Contractions Contractions of the uterus can occur throughout pregnancy, but they are not always a sign that you are in labor. You may have practice contractions called Braxton Hicks contractions. These false labor contractions are sometimes confused with true labor. What are Braxton Hicks contractions? Braxton Hicks contractions are tightening movements that occur in the muscles of the uterus before labor. Unlike true labor contractions, these contractions do not result in opening (dilation) and thinning of the cervix. Toward the end of pregnancy (32-34 weeks), Braxton Hicks contractions can happen more often and may become stronger. These contractions are sometimes difficult to tell apart from true labor because they can be very uncomfortable. You should not feel embarrassed if you go to the hospital with false labor. Sometimes, the only way to tell if you are in true labor is for your health care provider to look for changes in the cervix. The health care provider will   do a physical  exam and may monitor your contractions. If you are not in true labor, the exam should show that your cervix is not dilating and your water has not broken. If there are other health problems associated with your pregnancy, it is completely safe for you to be sent home with false labor. You may continue to have Braxton Hicks contractions until you go into true labor. How to tell the difference between true labor and false labor True labor  Contractions last 30-70 seconds.  Contractions become very regular.  Discomfort is usually felt in the top of the uterus, and it spreads to the lower abdomen and low back.  Contractions do not go away with walking.  Contractions usually become more intense and increase in frequency.  The cervix dilates and gets thinner. False labor  Contractions are usually shorter and not as strong as true labor contractions.  Contractions are usually irregular.  Contractions are often felt in the front of the lower abdomen and in the groin.  Contractions may go away when you walk around or change positions while lying down.  Contractions get weaker and are shorter-lasting as time goes on.  The cervix usually does not dilate or become thin. Follow these instructions at home:  Take over-the-counter and prescription medicines only as told by your health care provider.  Keep up with your usual exercises and follow other instructions from your health care provider.  Eat and drink lightly if you think you are going into labor.  If Braxton Hicks contractions are making you uncomfortable: ? Change your position from lying down or resting to walking, or change from walking to resting. ? Sit and rest in a tub of warm water. ? Drink enough fluid to keep your urine pale yellow. Dehydration may cause these contractions. ? Do slow and deep breathing several times an hour.  Keep all follow-up prenatal visits as told by your health care provider. This is  important. Contact a health care provider if:  You have a fever.  You have continuous pain in your abdomen. Get help right away if:  Your contractions become stronger, more regular, and closer together.  You have fluid leaking or gushing from your vagina.  You pass blood-tinged mucus (bloody show).  You have bleeding from your vagina.  You have low back pain that you never had before.  You feel your baby's head pushing down and causing pelvic pressure.  Your baby is not moving inside you as much as it used to. Summary  Contractions that occur before labor are called Braxton Hicks contractions, false labor, or practice contractions.  Braxton Hicks contractions are usually shorter, weaker, farther apart, and less regular than true labor contractions. True labor contractions usually become progressively stronger and regular and they become more frequent.  Manage discomfort from Braxton Hicks contractions by changing position, resting in a warm bath, drinking plenty of water, or practicing deep breathing. This information is not intended to replace advice given to you by your health care provider. Make sure you discuss any questions you have with your health care provider. Document Released: 12/27/2016 Document Revised: 12/27/2016 Document Reviewed: 12/27/2016 Elsevier Interactive Patient Education  2018 Elsevier Inc.    

## 2018-05-14 ENCOUNTER — Ambulatory Visit (INDEPENDENT_AMBULATORY_CARE_PROVIDER_SITE_OTHER): Payer: BLUE CROSS/BLUE SHIELD | Admitting: Family Medicine

## 2018-05-14 VITALS — BP 111/58 | HR 78 | Wt 146.8 lb

## 2018-05-14 DIAGNOSIS — Z3480 Encounter for supervision of other normal pregnancy, unspecified trimester: Secondary | ICD-10-CM

## 2018-05-14 DIAGNOSIS — Z3483 Encounter for supervision of other normal pregnancy, third trimester: Secondary | ICD-10-CM

## 2018-05-14 DIAGNOSIS — O09293 Supervision of pregnancy with other poor reproductive or obstetric history, third trimester: Secondary | ICD-10-CM

## 2018-05-14 DIAGNOSIS — O26843 Uterine size-date discrepancy, third trimester: Secondary | ICD-10-CM

## 2018-05-14 DIAGNOSIS — O09299 Supervision of pregnancy with other poor reproductive or obstetric history, unspecified trimester: Secondary | ICD-10-CM

## 2018-05-14 NOTE — Progress Notes (Signed)
   PRENATAL VISIT NOTE  Subjective:  Emily Frazier is a 22 y.o. G3P1011 at 7922w4d being seen today for ongoing prenatal care.  She is currently monitored for the following issues for this high-risk pregnancy and has Supervision of other normal pregnancy, antepartum; History of pre-eclampsia in prior pregnancy, currently pregnant; History of prior pregnancy with IUGR newborn; and Uterine size date discrepancy pregnancy, third trimester on their problem list.  - when had preE, really bad migraine night before - denies any headaches, blurry vision, swelling   Patient reports no complaints.  Contractions: Not present. Vag. Bleeding: None.  Movement: Present. Denies leaking of fluid.   The following portions of the patient's history were reviewed and updated as appropriate: allergies, current medications, past family history, past medical history, past social history, past surgical history and problem list. Problem list updated.  Objective:   Vitals:   05/14/18 1647  BP: (!) 111/58  Pulse: 78  Weight: 146 lb 12.8 oz (66.6 kg)    Fetal Status: Fetal Heart Rate (bpm): 145 Fundal Height: 34 cm Movement: Present     General:  Alert, oriented and cooperative. Patient is in no acute distress.  Skin: Skin is warm and dry. No rash noted.   Cardiovascular: Normal heart rate noted  Respiratory: Normal respiratory effort, no problems with respiration noted  Abdomen: Soft, gravid, appropriate for gestational age.  Pain/Pressure: Absent     Pelvic: Cervical exam deferred        Extremities: Normal range of motion.  Edema: None  Mental Status: Normal mood and affect. Normal behavior. Normal judgment and thought content.   Assessment and Plan:  Pregnancy: G3P1011 at 322w4d  1. Supervision of other normal pregnancy, antepartum -- prenatal record reviewed and UTD  -- continues to measure appropriately   2. History of pre-eclampsia in prior pregnancy, currently pregnant: Asymptomatic. BP has been  well-controlled.  BP Readings from Last 3 Encounters:  05/14/18 (!) 111/58  04/30/18 116/67  04/08/18 (!) 110/55  -- continue ASA -- counseled on signs/symptoms   Preterm labor symptoms and general obstetric precautions including but not limited to vaginal bleeding, contractions, leaking of fluid and fetal movement were reviewed in detail with the patient.Please refer to After Visit Summary for other counseling recommendations.   Return in about 2 weeks (around 05/28/2018).  Future Appointments  Date Time Provider Department Center  05/28/2018  4:15 PM Burleson, Brand Maleserri L, NP [facility]   [place]ily [place], [place]

## 2018-05-14 NOTE — Patient Instructions (Signed)

## 2018-05-28 ENCOUNTER — Other Ambulatory Visit (HOSPITAL_COMMUNITY)
Admission: RE | Admit: 2018-05-28 | Discharge: 2018-05-28 | Disposition: A | Discharge status: Home / Self Care | Payer: BLUE CROSS/BLUE SHIELD | Source: Ambulatory Visit | Attending: Nurse Practitioner | Admitting: Nurse Practitioner

## 2018-05-28 ENCOUNTER — Ambulatory Visit (INDEPENDENT_AMBULATORY_CARE_PROVIDER_SITE_OTHER): Payer: BLUE CROSS/BLUE SHIELD | Admitting: Nurse Practitioner

## 2018-05-28 VITALS — BP 105/60 | HR 82 | Wt 149.8 lb

## 2018-05-28 DIAGNOSIS — O09299 Supervision of pregnancy with other poor reproductive or obstetric history, unspecified trimester: Secondary | ICD-10-CM

## 2018-05-28 DIAGNOSIS — Z3483 Encounter for supervision of other normal pregnancy, third trimester: Secondary | ICD-10-CM

## 2018-05-28 DIAGNOSIS — Z3A36 36 weeks gestation of pregnancy: Secondary | ICD-10-CM | POA: Diagnosis not present

## 2018-05-28 DIAGNOSIS — O09293 Supervision of pregnancy with other poor reproductive or obstetric history, third trimester: Secondary | ICD-10-CM

## 2018-05-28 DIAGNOSIS — Z3480 Encounter for supervision of other normal pregnancy, unspecified trimester: Secondary | ICD-10-CM

## 2018-05-28 NOTE — Progress Notes (Signed)
    Subjective:  Emily Frazier is a 22 y.o. G3P1011 at [redacted]w[redacted]d being seen today for ongoing prenatal care.  She is currently monitored for the following issues for this low-risk pregnancy and has Supervision of other normal pregnancy, antepartum; History of pre-eclampsia in prior pregnancy, currently pregnant; History of prior pregnancy with IUGR newborn; and Uterine size date discrepancy pregnancy, third trimester on their problem list.  Patient reports no complaints.  Contractions: Not present. Vag. Bleeding: None.  Movement: Present. Denies leaking of fluid.   The following portions of the patient's history were reviewed and updated as appropriate: allergies, current medications, past family history, past medical history, past social history, past surgical history and problem list. Problem list updated.  Objective:   Vitals:   05/28/18 1626  BP: 105/60  Pulse: 82  Weight: 149 lb 12.8 oz (67.9 kg)    Fetal Status: Fetal Heart Rate (bpm): 150 Fundal Height: 35 cm Movement: Present     General:  Alert, oriented and cooperative. Patient is in no acute distress.  Skin: Skin is warm and dry. No rash noted.   Cardiovascular: Normal heart rate noted  Respiratory: Normal respiratory effort, no problems with respiration noted  Abdomen: Soft, gravid, appropriate for gestational age. Pain/Pressure: Absent     Pelvic:  Cervical exam deferred      Speculum exam done due to small amount of wetness seen on table paper - no pooling, no active leaking with valsalva, large amount of clear mucus seen near the cervix - likely mucus plug is coming out.  Did not wear a pad today,  Extremities: Normal range of motion.  Edema: None  Mental Status: Normal mood and affect. Normal behavior. Normal judgment and thought content.   Urinalysis:      Assessment and Plan:  Pregnancy: G3P1011 at [redacted]w[redacted]d  1. Supervision of other normal pregnancy, antepartum Advised to return with any leaking of fluid or  fever. Watch fundal height carefully  - GC/Chlamydia probe amp ([place])not at [facility] - Culture, beta strep (group b only)  2. History of pre-eclampsia in prior pregnancy, currently pregnant Continue to take low dose ASA until delivery  Preterm labor symptoms and general obstetric precautions including but not limited to vaginal bleeding, contractions, leaking of fluid and fetal movement were reviewed in detail with the patient. Please refer to After Visit Summary for other counseling recommendations.  Return in about 1 week (around 06/04/2018).  Nolene Bernheim, RN, MSN, NP-BC Nurse Practitioner, [facility] for [employer], [facility] 05/28/2018 4:45 PM

## 2018-05-29 LAB — GC/CHLAMYDIA PROBE AMP (~~LOC~~) NOT AT ARMC
Chlamydia: NEGATIVE
Neisseria Gonorrhea: NEGATIVE

## 2018-05-31 LAB — CULTURE, BETA STREP (GROUP B ONLY): Strep Gp B Culture: NEGATIVE

## 2018-06-02 ENCOUNTER — Encounter (HOSPITAL_COMMUNITY): Payer: Self-pay

## 2018-06-02 ENCOUNTER — Inpatient Hospital Stay (HOSPITAL_COMMUNITY)
Admission: AD | Admit: 2018-06-02 | Discharge: 2018-06-02 | Disposition: A | Discharge status: Home / Self Care | Payer: BLUE CROSS/BLUE SHIELD | Source: Ambulatory Visit | Attending: Obstetrics and Gynecology | Admitting: Obstetrics and Gynecology

## 2018-06-02 DIAGNOSIS — O26843 Uterine size-date discrepancy, third trimester: Secondary | ICD-10-CM

## 2018-06-02 DIAGNOSIS — N898 Other specified noninflammatory disorders of vagina: Secondary | ICD-10-CM | POA: Diagnosis present

## 2018-06-02 DIAGNOSIS — Z3689 Encounter for other specified antenatal screening: Secondary | ICD-10-CM

## 2018-06-02 DIAGNOSIS — Z3A37 37 weeks gestation of pregnancy: Secondary | ICD-10-CM | POA: Insufficient documentation

## 2018-06-02 DIAGNOSIS — Z3480 Encounter for supervision of other normal pregnancy, unspecified trimester: Secondary | ICD-10-CM

## 2018-06-02 DIAGNOSIS — Z0371 Encounter for suspected problem with amniotic cavity and membrane ruled out: Secondary | ICD-10-CM

## 2018-06-02 LAB — POCT FERN TEST: POCT Fern Test: NEGATIVE

## 2018-06-02 NOTE — MAU Provider Note (Signed)
First Provider Initiated Contact with Patient 06/02/18 2050     S: Ms. Emily Frazier is a 22 y.o. G3P1011 at [redacted]w[redacted]d  who presents to MAU today complaining of leaking of fluid since 1 week ago, she reports increased discharge over this week that has been white discharge. She denies having a gush of fluid or wearing a pad. Reports noticing discharge on underwear. She denies vaginal bleeding. She endorses contractions. She reports normal fetal movement.    O: BP 115/65 (BP Location: Right Arm)   Pulse 88   Temp 98.1 F (36.7 C) (Oral)   Resp 16   Ht 5\' 7"  (1.702 m)   Wt 66.7 kg   LMP 09/05/2017   SpO2 99%   BMI 23.02 kg/m  GENERAL: Well-developed, well-nourished female in no acute distress.  HEAD: Normocephalic, atraumatic.  CHEST: Normal effort of breathing, regular heart rate ABDOMEN: Soft, nontender, gravid PELVIC: Normal external female genitalia. Vagina is pink and rugated. Cervix with normal contour, no lesions. Normal discharge.  Negative pooling.   Cervical exam:  Dilation: Closed Effacement (%): 60 Station: -2 Exam by:: Steward Drone CNM   Fetal Monitoring: Baseline: 130 Variability: moderate Accelerations: present Decelerations: none Contractions: 2-3/ mild by palpation   POCT Fern negative   A: SIUP at [redacted]w[redacted]d  Membranes intact  P: Discharge home  Follow up as scheduled for prenatal appointment  Labor precautions and fetal kick counts  Sharyon Cable, CNM 06/02/2018 9:11 PM

## 2018-06-02 NOTE — MAU Note (Signed)
Pt states the past few days she has been leaking more fluid than usual. States the fluid is clear and she has had to change her panties once today.   Pt is not wearing pad. Pt denies vaginal bleeding or pain. Reports some pelvic pressure. Reports good fetal movement.

## 2018-06-02 NOTE — Discharge Instructions (Signed)
Reasons to return to MAU: ° °1.  Contractions are  3-4 minutes apart or less, each last 1 minute, these have been going on for 1-2 hours, and you cannot walk or talk during them °2.  You have a large gush of fluid, or a trickle of fluid that will not stop and you have to wear a pad °3.  You have bleeding that is bright red, heavier than spotting--like menstrual bleeding (spotting can be normal in early labor or after a check of your cervix) °4.  You do not feel the baby moving like he/she normally does ° °

## 2018-06-05 ENCOUNTER — Ambulatory Visit (INDEPENDENT_AMBULATORY_CARE_PROVIDER_SITE_OTHER): Payer: BLUE CROSS/BLUE SHIELD | Admitting: Obstetrics and Gynecology

## 2018-06-05 ENCOUNTER — Encounter: Payer: Medicaid Other | Admitting: Obstetrics and Gynecology

## 2018-06-05 VITALS — BP 105/69 | HR 91 | Wt 152.0 lb

## 2018-06-05 DIAGNOSIS — Z23 Encounter for immunization: Secondary | ICD-10-CM | POA: Diagnosis not present

## 2018-06-05 DIAGNOSIS — Z3483 Encounter for supervision of other normal pregnancy, third trimester: Secondary | ICD-10-CM

## 2018-06-05 DIAGNOSIS — Z3480 Encounter for supervision of other normal pregnancy, unspecified trimester: Secondary | ICD-10-CM

## 2018-06-05 NOTE — Progress Notes (Signed)
   PRENATAL VISIT NOTE  Subjective:  Emily Frazier is a 22 y.o. G3P1011 at [redacted]w[redacted]d being seen today for ongoing prenatal care.  She is currently monitored for the following issues for this low-risk pregnancy and has Supervision of other normal pregnancy, antepartum; History of pre-eclampsia in prior pregnancy, currently pregnant; History of prior pregnancy with IUGR newborn; and Uterine size date discrepancy pregnancy, third trimester on their problem list.  Patient reports no complaints.  Contractions: Not present. Vag. Bleeding: None.  Movement: Present. Denies leaking of fluid.   The following portions of the patient's history were reviewed and updated as appropriate: allergies, current medications, past family history, past medical history, past social history, past surgical history and problem list. Problem list updated.  Objective:   Vitals:   06/05/18 1327  BP: 105/69  Pulse: 91  Weight: 152 lb (68.9 kg)    Fetal Status: Fetal Heart Rate (bpm): 157 Fundal Height: 38 cm Movement: Present     General:  Alert, oriented and cooperative. Patient is in no acute distress.  Skin: Skin is warm and dry. No rash noted.   Cardiovascular: Normal heart rate noted  Respiratory: Normal respiratory effort, no problems with respiration noted  Abdomen: Soft, gravid, appropriate for gestational age.  Pain/Pressure: Present     Pelvic: Cervical exam deferred        Extremities: Normal range of motion.  Edema: None  Mental Status: Normal mood and affect. Normal behavior. Normal judgment and thought content.   Assessment and Plan:  Pregnancy: G3P1011 at [redacted]w[redacted]d  1. Supervision of other normal pregnancy, antepartum  - Flu Vaccine QUAD 36+ mos IM - Doing well, GBS negative.   Preterm labor symptoms and general obstetric precautions including but not limited to vaginal bleeding, contractions, leaking of fluid and fetal movement were reviewed in detail with the patient. Please refer to After Visit  Summary for other counseling recommendations.  Return in about 1 week (around 06/12/2018).  Future Appointments  Date Time Provider Department Center  06/11/2018  2:35 PM Currie Paris, NP [facility]  06/17/2018  4:40 PM Crisoforo Oxford, Charlesetta Garibaldi, CNM [facility]    Venia Carbon, NP

## 2018-06-11 ENCOUNTER — Ambulatory Visit (INDEPENDENT_AMBULATORY_CARE_PROVIDER_SITE_OTHER): Payer: BLUE CROSS/BLUE SHIELD | Admitting: Nurse Practitioner

## 2018-06-11 VITALS — BP 114/59 | HR 84 | Wt 154.0 lb

## 2018-06-11 DIAGNOSIS — O09299 Supervision of pregnancy with other poor reproductive or obstetric history, unspecified trimester: Secondary | ICD-10-CM

## 2018-06-11 DIAGNOSIS — Z3480 Encounter for supervision of other normal pregnancy, unspecified trimester: Secondary | ICD-10-CM

## 2018-06-11 NOTE — Progress Notes (Signed)
    Subjective:  Emily Frazier is a 22 y.o. G3P1011 at [redacted]w[redacted]d being seen today for ongoing prenatal care.  She is currently monitored for the following issues for this low-risk pregnancy and has Supervision of other normal pregnancy, antepartum; History of pre-eclampsia in prior pregnancy, currently pregnant; History of prior pregnancy with IUGR newborn; and Uterine size date discrepancy pregnancy, third trimester on their problem list.  Patient reports no complaints.  Contractions: Not present. Vag. Bleeding: None.  Movement: Present. Denies leaking of fluid.   The following portions of the patient's history were reviewed and updated as appropriate: allergies, current medications, past family history, past medical history, past social history, past surgical history and problem list. Problem list updated.  Objective:   Vitals:   06/11/18 1456  BP: (!) 114/59  Pulse: 84  Weight: 154 lb (69.9 kg)    Fetal Status: Fetal Heart Rate (bpm): 138 Fundal Height: 39 cm Movement: Present     General:  Alert, oriented and cooperative. Patient is in no acute distress.  Skin: Skin is warm and dry. No rash noted.   Cardiovascular: Normal heart rate noted  Respiratory: Normal respiratory effort, no problems with respiration noted  Abdomen: Soft, gravid, appropriate for gestational age. Pain/Pressure: Present     Pelvic:  Cervical exam deferred        Extremities: Normal range of motion.  Edema: None  Mental Status: Normal mood and affect. Normal behavior. Normal judgment and thought content.   Urinalysis:      Assessment and Plan:  Pregnancy: G3P1011 at [redacted]w[redacted]d  1. Supervision of other normal pregnancy, antepartum Doing well.  Awaiting labor  2. History of pre-eclampsia in prior pregnancy, currently pregnant Stopped low dose ASA  Term labor symptoms and general obstetric precautions including but not limited to vaginal bleeding, contractions, leaking of fluid and fetal movement were reviewed  in detail with the patient. Please refer to After Visit Summary for other counseling recommendations.  Return in about 1 week (around 06/18/2018).  Nolene Bernheim, RN, MSN, NP-BC Nurse Practitioner, [facility] for [employer], [facility] 06/11/2018 3:34 PM

## 2018-06-14 ENCOUNTER — Inpatient Hospital Stay (HOSPITAL_COMMUNITY): Payer: BLUE CROSS/BLUE SHIELD | Admitting: Anesthesiology

## 2018-06-14 ENCOUNTER — Inpatient Hospital Stay (HOSPITAL_COMMUNITY)
Admission: AD | Admit: 2018-06-14 | Discharge: 2018-06-15 | DRG: 807 | Disposition: A | Discharge status: Home / Self Care | Payer: BLUE CROSS/BLUE SHIELD | Attending: Obstetrics and Gynecology | Admitting: Obstetrics and Gynecology

## 2018-06-14 ENCOUNTER — Other Ambulatory Visit: Payer: Self-pay

## 2018-06-14 ENCOUNTER — Encounter (HOSPITAL_COMMUNITY): Payer: Self-pay

## 2018-06-14 DIAGNOSIS — O09299 Supervision of pregnancy with other poor reproductive or obstetric history, unspecified trimester: Secondary | ICD-10-CM

## 2018-06-14 DIAGNOSIS — Z3483 Encounter for supervision of other normal pregnancy, third trimester: Secondary | ICD-10-CM | POA: Diagnosis present

## 2018-06-14 DIAGNOSIS — Z3A39 39 weeks gestation of pregnancy: Secondary | ICD-10-CM

## 2018-06-14 DIAGNOSIS — Z3480 Encounter for supervision of other normal pregnancy, unspecified trimester: Secondary | ICD-10-CM

## 2018-06-14 DIAGNOSIS — Z87891 Personal history of nicotine dependence: Secondary | ICD-10-CM

## 2018-06-14 DIAGNOSIS — O26843 Uterine size-date discrepancy, third trimester: Secondary | ICD-10-CM

## 2018-06-14 LAB — TYPE AND SCREEN
ABO/RH(D): O POS
Antibody Screen: NEGATIVE

## 2018-06-14 LAB — CBC
HCT: 28.6 % — ABNORMAL LOW (ref 36.0–46.0)
HEMOGLOBIN: 9.7 g/dL — AB (ref 12.0–15.0)
MCH: 30.1 pg (ref 26.0–34.0)
MCHC: 33.9 g/dL (ref 30.0–36.0)
MCV: 88.8 fL (ref 80.0–100.0)
PLATELETS: 214 10*3/uL (ref 150–400)
RBC: 3.22 MIL/uL — AB (ref 3.87–5.11)
RDW: 12.2 % (ref 11.5–15.5)
WBC: 7.2 10*3/uL (ref 4.0–10.5)
nRBC: 0 % (ref 0.0–0.2)

## 2018-06-14 LAB — POCT FERN TEST: POCT FERN TEST: POSITIVE

## 2018-06-14 LAB — RPR: RPR: NONREACTIVE

## 2018-06-14 MED ORDER — ETONOGESTREL 68 MG ~~LOC~~ IMPL: 1.0000 | DRUG_IMPLANT | Freq: Once | SUBCUTANEOUS | 0 refills | Status: DC

## 2018-06-14 MED ORDER — WITCH HAZEL-GLYCERIN EX PADS: 1.0000 "application " | MEDICATED_PAD | CUTANEOUS | Status: DC | PRN

## 2018-06-14 MED ORDER — DIPHENHYDRAMINE HCL 25 MG PO CAPS: 25.0000 mg | ORAL_CAPSULE | Freq: Four times a day (QID) | ORAL | Status: DC | PRN

## 2018-06-14 MED ORDER — IBUPROFEN 600 MG PO TABS
600.0000 mg | ORAL_TABLET | Freq: Four times a day (QID) | ORAL | Status: DC
  Administered 2018-06-14 – 2018-06-15 (×4): 600 mg via ORAL
  Filled 2018-06-14 (×4): qty 1

## 2018-06-14 MED ORDER — PHENYLEPHRINE 40 MCG/ML (10ML) SYRINGE FOR IV PUSH (FOR BLOOD PRESSURE SUPPORT)
PREFILLED_SYRINGE | INTRAVENOUS | Status: AC
  Filled 2018-06-14: qty 10

## 2018-06-14 MED ORDER — SENNOSIDES-DOCUSATE SODIUM 8.6-50 MG PO TABS
2.0000 | ORAL_TABLET | ORAL | Status: DC
  Administered 2018-06-14: 2 via ORAL
  Filled 2018-06-14: qty 2

## 2018-06-14 MED ORDER — LACTATED RINGERS IV SOLN: 500.0000 mL | Freq: Once | INTRAVENOUS | Status: DC

## 2018-06-14 MED ORDER — LACTATED RINGERS IV SOLN
INTRAVENOUS | Status: DC
  Administered 2018-06-14: 08:00:00 via INTRAVENOUS
  Administered 2018-06-14: 500 mL via INTRAVENOUS

## 2018-06-14 MED ORDER — MEASLES, MUMPS & RUBELLA VAC ~~LOC~~ INJ: 0.5000 mL | INJECTION | Freq: Once | SUBCUTANEOUS | Status: DC

## 2018-06-14 MED ORDER — ONDANSETRON HCL 4 MG/2ML IJ SOLN: 4.0000 mg | Freq: Four times a day (QID) | INTRAMUSCULAR | Status: DC | PRN

## 2018-06-14 MED ORDER — COCONUT OIL OIL
1.0000 "application " | TOPICAL_OIL | Status: DC | PRN
  Filled 2018-06-14: qty 120

## 2018-06-14 MED ORDER — SIMETHICONE 80 MG PO CHEW: 80.0000 mg | CHEWABLE_TABLET | ORAL | Status: DC | PRN

## 2018-06-14 MED ORDER — OXYCODONE-ACETAMINOPHEN 5-325 MG PO TABS: 2.0000 | ORAL_TABLET | ORAL | Status: DC | PRN

## 2018-06-14 MED ORDER — TETANUS-DIPHTH-ACELL PERTUSSIS 5-2.5-18.5 LF-MCG/0.5 IM SUSP: 0.5000 mL | Freq: Once | INTRAMUSCULAR | Status: DC

## 2018-06-14 MED ORDER — DIPHENHYDRAMINE HCL 50 MG/ML IJ SOLN: 12.5000 mg | INTRAMUSCULAR | Status: DC | PRN

## 2018-06-14 MED ORDER — PHENYLEPHRINE 40 MCG/ML (10ML) SYRINGE FOR IV PUSH (FOR BLOOD PRESSURE SUPPORT)
80.0000 ug | PREFILLED_SYRINGE | INTRAVENOUS | Status: DC | PRN
  Filled 2018-06-14: qty 5

## 2018-06-14 MED ORDER — FENTANYL 2.5 MCG/ML BUPIVACAINE 1/10 % EPIDURAL INFUSION (WH - ANES)
14.0000 mL/h | INTRAMUSCULAR | Status: DC | PRN
  Administered 2018-06-14: 14 mL/h via EPIDURAL

## 2018-06-14 MED ORDER — EPHEDRINE 5 MG/ML INJ
10.0000 mg | INTRAVENOUS | Status: DC | PRN
  Filled 2018-06-14: qty 2

## 2018-06-14 MED ORDER — ACETAMINOPHEN 325 MG PO TABS: 650.0000 mg | ORAL_TABLET | ORAL | Status: DC | PRN

## 2018-06-14 MED ORDER — LIDOCAINE HCL (PF) 1 % IJ SOLN
INTRAMUSCULAR | Status: DC | PRN
  Administered 2018-06-14: 5 mL via EPIDURAL
  Administered 2018-06-14: 3 mL via EPIDURAL
  Administered 2018-06-14: 2 mL via EPIDURAL

## 2018-06-14 MED ORDER — FENTANYL 2.5 MCG/ML BUPIVACAINE 1/10 % EPIDURAL INFUSION (WH - ANES)
INTRAMUSCULAR | Status: AC
  Filled 2018-06-14: qty 100

## 2018-06-14 MED ORDER — OXYCODONE-ACETAMINOPHEN 5-325 MG PO TABS: 1.0000 | ORAL_TABLET | ORAL | Status: DC | PRN

## 2018-06-14 MED ORDER — LACTATED RINGERS IV SOLN: 500.0000 mL | INTRAVENOUS | Status: DC | PRN

## 2018-06-14 MED ORDER — OXYTOCIN BOLUS FROM INFUSION
500.0000 mL | Freq: Once | INTRAVENOUS | Status: AC
  Administered 2018-06-14: 500 mL via INTRAVENOUS

## 2018-06-14 MED ORDER — OXYTOCIN 40 UNITS IN LACTATED RINGERS INFUSION - SIMPLE MED
2.5000 [IU]/h | INTRAVENOUS | Status: DC
  Administered 2018-06-14: 2.5 [IU]/h via INTRAVENOUS
  Filled 2018-06-14: qty 1000

## 2018-06-14 MED ORDER — FLEET ENEMA 7-19 GM/118ML RE ENEM: 1.0000 | ENEMA | RECTAL | Status: DC | PRN

## 2018-06-14 MED ORDER — ONDANSETRON HCL 4 MG PO TABS: 4.0000 mg | ORAL_TABLET | ORAL | Status: DC | PRN

## 2018-06-14 MED ORDER — EPHEDRINE 5 MG/ML INJ: 10.0000 mg | INTRAVENOUS | Status: DC | PRN

## 2018-06-14 MED ORDER — LIDOCAINE HCL (PF) 1 % IJ SOLN
30.0000 mL | INTRAMUSCULAR | Status: DC | PRN
  Filled 2018-06-14: qty 30

## 2018-06-14 MED ORDER — DIBUCAINE 1 % RE OINT: 1.0000 "application " | TOPICAL_OINTMENT | RECTAL | Status: DC | PRN

## 2018-06-14 MED ORDER — PRENATAL MULTIVITAMIN CH
1.0000 | ORAL_TABLET | Freq: Every day | ORAL | Status: DC
  Administered 2018-06-15: 1 via ORAL
  Filled 2018-06-14: qty 1

## 2018-06-14 MED ORDER — ONDANSETRON HCL 4 MG/2ML IJ SOLN: 4.0000 mg | INTRAMUSCULAR | Status: DC | PRN

## 2018-06-14 MED ORDER — PHENYLEPHRINE 40 MCG/ML (10ML) SYRINGE FOR IV PUSH (FOR BLOOD PRESSURE SUPPORT): 80.0000 ug | PREFILLED_SYRINGE | INTRAVENOUS | Status: DC | PRN

## 2018-06-14 MED ORDER — SOD CITRATE-CITRIC ACID 500-334 MG/5ML PO SOLN: 30.0000 mL | ORAL | Status: DC | PRN

## 2018-06-14 MED ORDER — BENZOCAINE-MENTHOL 20-0.5 % EX AERO: 1.0000 "application " | INHALATION_SPRAY | CUTANEOUS | Status: DC | PRN

## 2018-06-14 NOTE — H&P (Signed)
LABOR AND DELIVERY ADMISSION HISTORY AND PHYSICAL NOTE  Emily Frazier is a 22 y.o. female G3P1011 with IUP at [redacted]w[redacted]d by 1st trimester [place] presenting for SOL.  Started contracting at home and came in this morning when contractions continued to become stronger and more frequent. Water broke after arrival in MAU. 1st baby weighed 4lb11oz and born at 37 weeks. Gained 40lbs this pregnancy.  She reports positive fetal movement. She denies leakage of fluid or vaginal bleeding.  Prenatal History/Complications: PNC at [facility] Pregnancy complications:  - none  Past Medical History: Past Medical History:  Diagnosis Date  . Pregnancy induced hypertension     Past Surgical History: Past Surgical History:  Procedure Laterality Date  . WISDOM TOOTH EXTRACTION      Obstetrical History: OB History    Gravida  3   Para  1   Term  1   Preterm  0   AB  1   Living  1     SAB  1   TAB  0   Ectopic  0   Multiple  0   Live Births  1           Social History: Social History   Socioeconomic History  . Marital status: Single    Spouse name: Not on file  . Number of children: Not on file  . Years of education: Not on file  . Highest education level: Not on file  Occupational History  . Not on file  Social Needs  . Financial resource strain: Not on file  . Food insecurity:    Worry: Not on file    Inability: Not on file  . Transportation needs:    Medical: Not on file    Non-medical: Not on file  Tobacco Use  . Smoking status: Former Smoker    Packs/day: 0.10    Last attempt to quit: 08/27/2016    Years since quitting: 1.7  . Smokeless tobacco: Never Used  Substance and Sexual Activity  . Alcohol use: No  . Drug use: No  . Sexual activity: Yes    Birth control/protection: None  Lifestyle  . Physical activity:    Days per week: Not on file    Minutes per session: Not on file  . Stress: Not on file  Relationships  . Social connections:    Talks on phone: Not on  file    Gets together: Not on file    Attends religious service: Not on file    Active member of club or organization: Not on file    Attends meetings of clubs or organizations: Not on file    Relationship status: Not on file  Other Topics Concern  . Not on file  Social History Narrative  . Not on file    Family History: Family History  Problem Relation Age of Onset  . Cancer Sister 6  . Arthritis Neg Hx   . Diabetes Neg Hx   . Heart disease Neg Hx   . Hypertension Neg Hx   . Stroke Neg Hx     Allergies: No Known Allergies  Medications Prior to Admission  Medication Sig Dispense Refill Last Dose  . Prenatal Vit-Fe Fumarate-FA (PRENATAL MULTIVITAMIN) TABS tablet Take 1 tablet by mouth daily at 12 noon.   Past Week at Unknown time  . aspirin EC 81 MG tablet Take 1 tablet (81 mg total) by mouth daily. Start taking at [redacted] weeks gestation, 12/07/16, take daily until 36 weeks (Patient  not taking: Reported on 06/05/2018) 30 tablet 11 Unknown at Unknown time     Review of Systems  All systems reviewed and negative except as stated in HPI  Physical Exam Blood pressure 122/72, pulse 87, temperature 98.1 F (36.7 C), resp. rate 18, height 5\' 7"  (1.702 m), weight 68 kg, last menstrual period 09/05/2017. General appearance: alert, oriented, NAD Lungs: normal respiratory effort Heart: regular rate Abdomen: soft, non-tender; gravid, FH appropriate for GA Extremities: No calf swelling or tenderness Presentation: cephalic Fetal monitoring: 120s/mod variability/+ acels/no decels Uterine activity: regular contractions q2-3 minutes Dilation: 4.5 Effacement (%): 80 Station: -2 Exam by:: Marvel Plan RN   Prenatal labs: ABO, Rh: O/Positive/-- (04/08 1438) Antibody: Negative (04/08 1438) Rubella: 5.59 (04/08 1438) RPR: Non Reactive (07/29 0940)  HBsAg: Negative (04/08 1438)  HIV: Non Reactive (07/29 0940)  GC/Chlamydia: neg/neg GBS:   negative 2-hr GTT: 79/125/64 Genetic  screening:  negative Anatomy US: normal  Prenatal Transfer Tool  Maternal Diabetes: No Genetic Screening: Normal Maternal Ultrasounds/Referrals: Normal Fetal Ultrasounds or other Referrals:  None Maternal Substance Abuse:  No Significant Maternal Medications:  None Significant Maternal Lab Results: None  Results for orders placed or performed during the hospital encounter of 06/14/18 (from the past 24 hour(s))  POCT fern test   Collection Time: 06/14/18  7:01 AM  Result Value Ref Range   POCT Fern Test Positive = ruptured amniotic membanes   CBC   Collection Time: 06/14/18  8:13 AM  Result Value Ref Range   WBC 7.2 4.0 - 10.5 K/uL   RBC 3.22 (L) 3.87 - 5.11 MIL/uL   Hemoglobin 9.7 (L) 12.0 - 15.0 g/dL   HCT 69.6 (L) 29.5 - 28.4 %   MCV 88.8 80.0 - 100.0 fL   MCH 30.1 26.0 - 34.0 pg   MCHC 33.9 30.0 - 36.0 g/dL   RDW 13.2 44.0 - 10.2 %   Platelets 214 150 - 400 K/uL   nRBC 0.0 0.0 - 0.2 %    Patient Active Problem List   Diagnosis Date Noted  . Labor and delivery, indication for care 06/14/2018  . Uterine size date discrepancy pregnancy, third trimester 04/08/2018  . Supervision of other normal pregnancy, antepartum 12/02/2017  . History of pre-eclampsia in prior pregnancy, currently pregnant 12/02/2017  . History of prior pregnancy with IUGR newborn 12/02/2017    Assessment: Emily Frazier is a 22 y.o. G3P1011 at [redacted]w[redacted]d here for SOL  #Labor: active #Pain: Epidural placed on admission  #FWB: Cat I strip  #ID:  GBS neg #MOF: Breast #MOC:nexplanon  #Circ:  NA  Gwenevere Abbot, MD 06/14/2018, 8:47 AM

## 2018-06-14 NOTE — Anesthesia Preprocedure Evaluation (Signed)
Anesthesia Evaluation  Patient identified by MRN, date of birth, ID band Patient awake    Reviewed: Allergy & Precautions, Patient's Chart, lab work & pertinent test results  Airway Mallampati: II  TM Distance: >3 FB     Dental   Pulmonary former smoker,    breath sounds clear to auscultation       Cardiovascular negative cardio ROS   Rhythm:Regular Rate:Normal     Neuro/Psych negative neurological ROS     GI/Hepatic negative GI ROS, Neg liver ROS,   Endo/Other  negative endocrine ROS  Renal/GU negative Renal ROS     Musculoskeletal   Abdominal   Peds  Hematology negative hematology ROS (+)   Anesthesia Other Findings   Reproductive/Obstetrics (+) Pregnancy                             Lab Results  Component Value Date   WBC 7.2 06/14/2018   HGB 9.7 (L) 06/14/2018   HCT 28.6 (L) 06/14/2018   MCV 88.8 06/14/2018   PLT 214 06/14/2018   Anesthesia Physical Anesthesia Plan  ASA: II  Anesthesia Plan: Epidural   Post-op Pain Management:    Induction:   PONV Risk Score and Plan: Treatment may vary due to age or medical condition  Airway Management Planned: Natural Airway  Additional Equipment:   Intra-op Plan:   Post-operative Plan:   Informed Consent: I have reviewed the patients History and Physical, chart, labs and discussed the procedure including the risks, benefits and alternatives for the proposed anesthesia with the patient or authorized representative who has indicated his/her understanding and acceptance.     Plan Discussed with:   Anesthesia Plan Comments:         Anesthesia Quick Evaluation

## 2018-06-14 NOTE — MAU Note (Signed)
Ctxs since 0500. Denies LOF or bleeding. Cervix closed last sve

## 2018-06-14 NOTE — MAU Note (Signed)
Walking from Triage to Rm #5 pt felt gush fld. Fern slide obtained and positive. Fld clear

## 2018-06-14 NOTE — Anesthesia Procedure Notes (Signed)
Epidural Patient location during procedure: OB Start time: 06/14/2018 8:38 AM End time: 06/14/2018 8:45 AM  Staffing Anesthesiologist: Marcene Duos, MD  Preanesthetic Checklist Completed: patient identified, site marked, surgical consent, pre-op evaluation, timeout performed, IV checked, risks and benefits discussed and monitors and equipment checked  Epidural Patient position: sitting Prep: site prepped and draped and DuraPrep Patient monitoring: continuous pulse ox and blood pressure Approach: midline Location: L4-L5 Injection technique: LOR air  Needle:  Needle type: Tuohy  Needle gauge: 17 G Needle length: 9 cm and 9 Needle insertion depth: 4 cm Catheter type: closed end flexible Catheter size: 19 Gauge Catheter at skin depth: 9 cm Test dose: negative  Assessment Events: blood not aspirated, injection not painful, no injection resistance, negative IV test and no paresthesia

## 2018-06-15 MED ORDER — IBUPROFEN 600 MG PO TABS: 600.0000 mg | ORAL_TABLET | Freq: Four times a day (QID) | ORAL | 0 refills | Status: DC

## 2018-06-15 NOTE — Anesthesia Postprocedure Evaluation (Signed)
Anesthesia Post Note  Patient: Emily Frazier  Procedure(s) Performed: AN AD HOC LABOR EPIDURAL     Patient location during evaluation: Mother Baby Anesthesia Type: Epidural Level of consciousness: awake and alert, oriented and patient cooperative Pain management: pain level controlled Vital Signs Assessment: post-procedure vital signs reviewed and stable Respiratory status: spontaneous breathing Cardiovascular status: stable Postop Assessment: no headache, epidural receding, patient able to bend at knees and no signs of nausea or vomiting Anesthetic complications: no Comments: Pain score 2.    Last Vitals:  Vitals:   06/15/18 0000 06/15/18 0400  BP: 103/64 110/72  Pulse:  82  Resp: 16 16  Temp: 36.7 C 36.9 C  SpO2: 100%     Last Pain:  Vitals:   06/15/18 0400  TempSrc: Oral  PainSc: 0-No pain   Pain Goal: Patients Stated Pain Goal: 0 (06/14/18 7829)               Merrilyn Puma

## 2018-06-15 NOTE — Discharge Summary (Signed)
Obstetrics Discharge Summary OB/GYN Faculty Practice   Patient Name: Emily Frazier DOB: [birth year] MRN: [id number]  Date of admission: 06/14/2018 Delivering MD: Gwenevere Abbot   Date of discharge: 06/15/2018  Admitting diagnosis: 39wks ctxs 2 to 3 mins Intrauterine pregnancy: [redacted]w[redacted]d     Secondary diagnosis:   Active Problems:   History of pre-eclampsia in prior pregnancy, currently pregnant   Uterine size date discrepancy pregnancy, third trimester   Labor and delivery, indication for care  Additional problems:  . History of preeclampsia in prior pregnancy     Discharge diagnosis: Term Pregnancy Delivered                                            Postpartum procedures: None  Complications: none  Hospital course: Emily Frazier is a 22 y.o. [redacted]w[redacted]d who was admitted for spontaneous onset of labor. Her pregnancy was complicated by history of preeclampsia and IUGR in prior infant, but blood pressure well-controlled this pregnancy. Her labor course was notable for epidural placement. Delivery was uncomplicated. Please see delivery/op note for additional details. Her postpartum course was uncomplicated. She was breastfeeding without difficulty. By day of discharge, she was passing flatus, urinating, eating and drinking without difficulty. Her pain was well-controlled, and she was discharged home with ibuprofen. She will follow-up in clinic in 4-6 weeks.   Physical exam  Vitals:   06/14/18 1555 06/14/18 2000 06/15/18 0000 06/15/18 0400  BP: 99/75 101/61 103/64 110/72  Pulse: 73 71  82  Resp: 16 18 16 16   Temp: 98.4 F (36.9 C) 98.9 F (37.2 C) 98.1 F (36.7 C) 98.5 F (36.9 C)  TempSrc: Oral Oral Oral Oral  SpO2: 99% 100% 100%   Weight:      Height:       General: well-appearing, NAD Lochia: appropriate Uterine Fundus: firm Incision: N/A DVT Evaluation: No significant calf/ankle edema. Labs: Lab Results  Component Value Date   WBC 7.2 06/14/2018   HGB 9.7 (L)  06/14/2018   HCT 28.6 (L) 06/14/2018   MCV 88.8 06/14/2018   PLT 214 06/14/2018   CMP Latest Ref Rng & Units 12/02/2017  Glucose 65 - 99 mg/dL 73  BUN 6 - 20 mg/dL 7  Creatinine 1.19 - 1.47 mg/dL 8.29  Sodium 562 - 130 mmol/L 136  Potassium 3.5 - 5.2 mmol/L 4.6  Chloride 96 - 106 mmol/L 100  CO2 20 - 29 mmol/L 23  Calcium 8.7 - 10.2 mg/dL 9.6  Total Protein 6.0 - 8.5 g/dL 7.0  Total Bilirubin 0.0 - 1.2 mg/dL 0.3  Alkaline Phos 39 - 117 IU/L 55  AST 0 - 40 IU/L 15  ALT 0 - 32 IU/L 11    Discharge instructions: Per After Visit Summary and "Baby and Me Booklet"  After visit meds:  Allergies as of 06/15/2018   No Known Allergies     Medication List    TAKE these medications   etonogestrel 68 MG Impl implant Commonly known as:  NEXPLANON 1 each (68 mg total) by Subdermal route once for 1 dose.   ibuprofen 600 MG tablet Commonly known as:  ADVIL,MOTRIN Take 1 tablet (600 mg total) by mouth every 6 (six) hours.   prenatal multivitamin Tabs tablet Take 1 tablet by mouth daily at 12 noon.      Postpartum contraception: Nexplanon Diet: Routine Diet Activity: Advance as tolerated. Pelvic rest  for 6 weeks.   Outpatient follow up: 4-6 weeks, message sent to schedule  Newborn Data: Live born female  Birth Weight: 6 lb 15.5 oz (3160 g) APGAR: 9, 9  Newborn Delivery   Birth date/time:  06/14/2018 13:22:00 Delivery type:  Vaginal, Spontaneous    Baby Feeding: Breast Disposition:home with mother  Cristal Deer. Earlene Plater, DO OB/GYN Fellow, Faculty Practice

## 2018-06-17 ENCOUNTER — Encounter: Payer: Medicaid Other | Admitting: Student

## 2018-07-15 ENCOUNTER — Encounter: Payer: Self-pay | Admitting: Student

## 2018-07-15 ENCOUNTER — Ambulatory Visit (INDEPENDENT_AMBULATORY_CARE_PROVIDER_SITE_OTHER): Payer: BLUE CROSS/BLUE SHIELD | Admitting: Student

## 2018-07-15 VITALS — BP 93/62 | HR 83 | Ht 66.0 in | Wt 144.0 lb

## 2018-07-15 DIAGNOSIS — Z362 Encounter for other antenatal screening follow-up: Secondary | ICD-10-CM

## 2018-07-15 DIAGNOSIS — Z3040 Encounter for surveillance of contraceptives, unspecified: Secondary | ICD-10-CM

## 2018-07-15 DIAGNOSIS — Z3046 Encounter for surveillance of implantable subdermal contraceptive: Secondary | ICD-10-CM

## 2018-07-15 LAB — POCT PREGNANCY, URINE: PREG TEST UR: NEGATIVE

## 2018-07-15 MED ORDER — ETONOGESTREL 68 MG ~~LOC~~ IMPL
68.0000 mg | DRUG_IMPLANT | Freq: Once | SUBCUTANEOUS | Status: AC
  Administered 2018-07-15: 68 mg via SUBCUTANEOUS

## 2018-07-15 NOTE — Patient Instructions (Addendum)
Nexplanon Instructions After Insertion DO NOT HAVE INTERCOURSE FOR 7 DAYS OR USE A CONDOM    Keep bandage clean and dry for 24 hours   May use ice/Tylenol/Ibuprofen for soreness or pain   If you develop fever, drainage or increased warmth from incision site-contact office immediately   Take pregnancy test in two weeks

## 2018-07-15 NOTE — Progress Notes (Addendum)
Subjective:     Emily Frazier is a 22 y.o. female who presents for a postpartum visit. She is 4 weeks postpartum following a spontaneous vaginal delivery. I have fully reviewed the prenatal and intrapartum course. The delivery was at 39 gestational weeks. Outcome: spontaneous vaginal delivery. Anesthesia: epidural. Postpartum course has been uneventful. Baby's course has been unevetnful. Baby is feeding by bottle - gerber. Bleeding no bleeding. Bowel function is normal. Bladder function is normal. Patient is sexually active. Contraception method is none. Postpartum depression screening: negative.  The following portions of the patient's history were reviewed and updated as appropriate: allergies, current medications, past family history, past medical history, past social history, past surgical history and problem list.  Review of Systems Pertinent items are noted in HPI.   Objective:    BP 93/62   Pulse 83   Ht 5\' 6"  (1.676 m)   Wt 144 lb (65.3 kg)   LMP 09/05/2017   Breastfeeding? No   BMI 23.24 kg/m   General:  alert, cooperative and no distress   Breasts:  inspection negative, no nipple discharge or bleeding, no masses or nodularity palpable  Lungs: clear to auscultation bilaterally  Heart:  regular rate and rhythm, S1, S2 normal, no murmur, click, rub or gallop  Abdomen: not evaluated    Vulva:  not evaluated  Vagina: not evaluated  Cervix:  Not evaluated.   Corpus: not examined  Adnexa:  not evaluated  Rectal Exam: Not performed.        Assessment:    Patient has not gotten her period back; denies pregnancy but she will take a pregnancy test in 2 weeks.  Otherwise postpartum exam. Pap smear not done at today's visit.   Plan:    1. Contraception: Nexplanon. Patient to use condoms for 7 days.  2. Take pregnancy test in two weeks.  3. Follow up in: 1 year  or as needed.     NEXPLANON INSERTION: Appropriate time out taken. Nexlanon site (left arm) identified and the  area was prepped in usual sterile fashon. 2 cc of 1% lidocaine was used to anesthetize the area starting with the distal end.   Next, the area was cleansed with betadine and the Nexplanon was inserted without difficulty.  Pressure bandage was applied.  Pt was instructed to remove pressure bandage in a few hours, and keep insertion site covered with a bandaid for 3 days.

## 2018-08-06 ENCOUNTER — Inpatient Hospital Stay (HOSPITAL_COMMUNITY)
Admission: AD | Admit: 2018-08-06 | Discharge: 2018-08-06 | Disposition: A | Discharge status: Home / Self Care | Payer: BLUE CROSS/BLUE SHIELD | Attending: Obstetrics & Gynecology | Admitting: Obstetrics & Gynecology

## 2018-08-06 ENCOUNTER — Encounter (HOSPITAL_COMMUNITY): Payer: Self-pay

## 2018-08-06 ENCOUNTER — Other Ambulatory Visit: Payer: Self-pay

## 2018-08-06 DIAGNOSIS — A0819 Acute gastroenteropathy due to other small round viruses: Secondary | ICD-10-CM | POA: Insufficient documentation

## 2018-08-06 DIAGNOSIS — Z3202 Encounter for pregnancy test, result negative: Secondary | ICD-10-CM | POA: Diagnosis not present

## 2018-08-06 DIAGNOSIS — A0811 Acute gastroenteropathy due to Norwalk agent: Secondary | ICD-10-CM | POA: Diagnosis not present

## 2018-08-06 DIAGNOSIS — R109 Unspecified abdominal pain: Secondary | ICD-10-CM | POA: Diagnosis present

## 2018-08-06 DIAGNOSIS — Z87891 Personal history of nicotine dependence: Secondary | ICD-10-CM | POA: Insufficient documentation

## 2018-08-06 LAB — URINALYSIS, ROUTINE W REFLEX MICROSCOPIC
Bilirubin Urine: NEGATIVE
GLUCOSE, UA: NEGATIVE mg/dL
Hgb urine dipstick: NEGATIVE
KETONES UR: 5 mg/dL — AB
LEUKOCYTES UA: NEGATIVE
Nitrite: NEGATIVE
PH: 5 (ref 5.0–8.0)
PROTEIN: NEGATIVE mg/dL
SPECIFIC GRAVITY, URINE: 1.027 (ref 1.005–1.030)

## 2018-08-06 LAB — CBC WITH DIFFERENTIAL/PLATELET
Basophils Absolute: 0 10*3/uL (ref 0.0–0.1)
Basophils Relative: 0 %
Eosinophils Absolute: 0.1 10*3/uL (ref 0.0–0.5)
Eosinophils Relative: 1 %
HCT: 37.3 % (ref 36.0–46.0)
Hemoglobin: 12.2 g/dL (ref 12.0–15.0)
LYMPHS ABS: 1.3 10*3/uL (ref 0.7–4.0)
LYMPHS PCT: 13 %
MCH: 29.5 pg (ref 26.0–34.0)
MCHC: 32.7 g/dL (ref 30.0–36.0)
MCV: 90.3 fL (ref 80.0–100.0)
Monocytes Absolute: 0.3 10*3/uL (ref 0.1–1.0)
Monocytes Relative: 3 %
Neutro Abs: 8.4 10*3/uL — ABNORMAL HIGH (ref 1.7–7.7)
Neutrophils Relative %: 83 %
Platelets: 238 10*3/uL (ref 150–400)
RBC: 4.13 MIL/uL (ref 3.87–5.11)
RDW: 13.2 % (ref 11.5–15.5)
WBC: 10 10*3/uL (ref 4.0–10.5)
nRBC: 0 % (ref 0.0–0.2)

## 2018-08-06 LAB — WET PREP, GENITAL
Clue Cells Wet Prep HPF POC: NONE SEEN
Sperm: NONE SEEN
Trich, Wet Prep: NONE SEEN
Yeast Wet Prep HPF POC: NONE SEEN

## 2018-08-06 LAB — POCT PREGNANCY, URINE: Preg Test, Ur: NEGATIVE

## 2018-08-06 MED ORDER — KETOROLAC TROMETHAMINE 60 MG/2ML IM SOLN
60.0000 mg | Freq: Once | INTRAMUSCULAR | Status: AC
  Administered 2018-08-06: 60 mg via INTRAMUSCULAR
  Filled 2018-08-06: qty 2

## 2018-08-06 MED ORDER — ONDANSETRON 8 MG PO TBDP: 8.0000 mg | ORAL_TABLET | Freq: Three times a day (TID) | ORAL | 0 refills | Status: DC | PRN

## 2018-08-06 MED ORDER — SIMETHICONE 80 MG PO CHEW
160.0000 mg | CHEWABLE_TABLET | Freq: Once | ORAL | Status: AC
  Administered 2018-08-06: 160 mg via ORAL
  Filled 2018-08-06: qty 2

## 2018-08-06 MED ORDER — IBUPROFEN 600 MG PO TABS: 600.0000 mg | ORAL_TABLET | Freq: Four times a day (QID) | ORAL | 0 refills | Status: DC | PRN

## 2018-08-06 MED ORDER — ONDANSETRON 8 MG PO TBDP
8.0000 mg | ORAL_TABLET | Freq: Once | ORAL | Status: AC
  Administered 2018-08-06: 8 mg via ORAL
  Filled 2018-08-06: qty 1

## 2018-08-06 MED ORDER — FAMOTIDINE 20 MG PO TABS: 40.0000 mg | ORAL_TABLET | Freq: Once | ORAL | Status: DC

## 2018-08-06 NOTE — MAU Note (Addendum)
Presents with c/o abdominal cramping & possible pregnancy.  Negative UPT on Monday.  Currently has Nexplanon contraception.  Reports NSVD 06/14/2018 and LMP beginning of December.

## 2018-08-06 NOTE — Discharge Instructions (Signed)

## 2018-08-06 NOTE — MAU Provider Note (Signed)
History     CSN: [id number]  Arrival date and time: 08/06/18 1142   First Provider Initiated Contact with Patient 08/06/18 1350      Chief Complaint  Patient presents with  . Possible Pregnancy  . Cramping   HPI    Emily Frazier is a 22 y.o. female [phone] here with concerns about pregnancy and abdominal cramping. She says that her period ended on Friday and today she woke up with lower abdominal pain. The comes and goes. This is the first time she has ever had this pain before. She thinks she may have had a miscarriage due to the pain.  No fever. Nexplanon in place. She has had 3 episodes of liquid diarrhea and 3 episodes of vomiting since she woke up. She is also concerned about a stomach bug.   OB History    Gravida  3   Para  2   Term  2   Preterm  0   AB  1   Living  2     SAB  1   TAB  0   Ectopic  0   Multiple  0   Live Births  2           Past Medical History:  Diagnosis Date  . Pregnancy induced hypertension     Past Surgical History:  Procedure Laterality Date  . WISDOM TOOTH EXTRACTION      Family History  Problem Relation Age of Onset  . Cancer Sister 6  . Arthritis Neg Hx   . Diabetes Neg Hx   . Heart disease Neg Hx   . Hypertension Neg Hx   . Stroke Neg Hx     Social History   Tobacco Use  . Smoking status: Former Smoker    Packs/day: 0.10    Last attempt to quit: 08/27/2016    Years since quitting: 1.9  . Smokeless tobacco: Never Used  Substance Use Topics  . Alcohol use: No  . Drug use: No    Allergies: No Known Allergies  Medications Prior to Admission  Medication Sig Dispense Refill Last Dose  . etonogestrel (NEXPLANON) 68 MG IMPL implant 1 each (68 mg total) by Subdermal route once for 1 dose. 1 each 0   . ibuprofen (ADVIL,MOTRIN) 600 MG tablet Take 1 tablet (600 mg total) by mouth every 6 (six) hours. 30 tablet 0 Taking   Results for orders placed or performed during the hospital encounter of 08/06/18  (from the past 48 hour(s))  Pregnancy, urine POC     Status: None   Collection Time: 08/06/18 12:59 PM  Result Value Ref Range   Preg Test, Ur NEGATIVE NEGATIVE    Comment:        THE SENSITIVITY OF THIS METHODOLOGY IS >24 mIU/mL   Urinalysis, Routine w reflex microscopic     Status: Abnormal   Collection Time: 08/06/18  1:02 PM  Result Value Ref Range   Color, Urine YELLOW YELLOW   APPearance HAZY (A) CLEAR   Specific Gravity, Urine 1.027 1.005 - 1.030   pH 5.0 5.0 - 8.0   Glucose, UA NEGATIVE NEGATIVE mg/dL   Hgb urine dipstick NEGATIVE NEGATIVE   Bilirubin Urine NEGATIVE NEGATIVE   Ketones, ur 5 (A) NEGATIVE mg/dL   Protein, ur NEGATIVE NEGATIVE mg/dL   Nitrite NEGATIVE NEGATIVE   Leukocytes, UA NEGATIVE NEGATIVE    Comment: Performed at [facility], [address], [place], [place] [address]  CBC with Differential  Status: Abnormal   Collection Time: 08/06/18  2:10 PM  Result Value Ref Range   WBC 10.0 4.0 - 10.5 K/uL   RBC 4.13 3.87 - 5.11 MIL/uL   Hemoglobin 12.2 12.0 - 15.0 g/dL   HCT 52.837.3 41.336.0 - 24.446.0 %   MCV 90.3 80.0 - 100.0 fL   MCH 29.5 26.0 - 34.0 pg   MCHC 32.7 30.0 - 36.0 g/dL   RDW 01.013.2 27.211.5 - 53.615.5 %   Platelets 238 150 - 400 K/uL   nRBC 0.0 0.0 - 0.2 %   Neutrophils Relative % 83 %   Neutro Abs 8.4 (H) 1.7 - 7.7 K/uL   Lymphocytes Relative 13 %   Lymphs Abs 1.3 0.7 - 4.0 K/uL   Monocytes Relative 3 %   Monocytes Absolute 0.3 0.1 - 1.0 K/uL   Eosinophils Relative 1 %   Eosinophils Absolute 0.1 0.0 - 0.5 K/uL   Basophils Relative 0 %   Basophils Absolute 0.0 0.0 - 0.1 K/uL    Comment: Performed at [facility], [address] [place], [place] [address]  Wet prep, genital     Status: Abnormal   Collection Time: 08/06/18  2:18 PM  Result Value Ref Range   Yeast Wet Prep HPF POC NONE SEEN NONE SEEN   Trich, Wet Prep NONE SEEN NONE SEEN   Clue Cells Wet Prep HPF POC NONE SEEN NONE SEEN   WBC, Wet Prep HPF POC FEW (A) NONE SEEN    Comment:  MANY BACTERIA SEEN   Sperm NONE SEEN     Comment: Performed at [facility], [address] [place], [place] [address]   Review of Systems  Gastrointestinal: Positive for diarrhea (3x liquid diarrhea ), nausea and vomiting (3x vomiting ).   Physical Exam   Blood pressure 109/64, pulse (!) 56, temperature 97.9 F (36.6 C), temperature source Oral, resp. rate 18, height 5\' 6"  (1.676 m), weight 62.9 kg, SpO2 100 %, not currently breastfeeding.  Physical Exam  Constitutional: She is oriented to person, place, and time. She appears well-developed and well-nourished.  Non-toxic appearance. She does not have a sickly appearance. She does not appear ill. No distress.  HENT:  Head: Normocephalic.  Eyes: Pupils are equal, round, and reactive to light.  Respiratory: Effort normal.  GI: Soft. She exhibits no distension and no mass. There is no tenderness. There is no rebound and no guarding.  Musculoskeletal: Normal range of motion.  Neurological: She is alert and oriented to person, place, and time.  Skin: Skin is warm. She is not diaphoretic.  Psychiatric: Her behavior is normal.    MAU Course  Procedures  None  MDM  Wet prep & GC collected UA  Urine pregnancy test negative. Patient did not have a positive pregnancy test at home. Nexplanon in place.  Toradol and Zofran given in MAU CBC with diff: normal WBC  Patient here with abdominal pain; negative WBC, negative fever. She is without rebound or RLQ pain. Symptoms consistent with Gi virus.   Assessment and Plan   A:  1. Norovirus   2. Abdominal cramping     P:  Discharge home in stable condition  Rx: Zofran, Ibuprofen If symptoms worsen [facility] Return to MAU for OBGYN emergencies.   Duane Lopeasch, Jennifer I, NP  08/06/2018

## 2018-08-07 LAB — GC/CHLAMYDIA PROBE AMP (~~LOC~~) NOT AT ARMC
Chlamydia: NEGATIVE
Neisseria Gonorrhea: NEGATIVE

## 2018-08-11 ENCOUNTER — Emergency Department (HOSPITAL_COMMUNITY)
Admission: EM | Admit: 2018-08-11 | Discharge: 2018-08-11 | Disposition: A | Discharge status: Home / Self Care | Payer: BLUE CROSS/BLUE SHIELD | Attending: Emergency Medicine | Admitting: Emergency Medicine

## 2018-08-11 ENCOUNTER — Encounter (HOSPITAL_COMMUNITY): Payer: Self-pay

## 2018-08-11 DIAGNOSIS — A084 Viral intestinal infection, unspecified: Secondary | ICD-10-CM | POA: Diagnosis not present

## 2018-08-11 DIAGNOSIS — Z87891 Personal history of nicotine dependence: Secondary | ICD-10-CM | POA: Insufficient documentation

## 2018-08-11 DIAGNOSIS — R1084 Generalized abdominal pain: Secondary | ICD-10-CM | POA: Diagnosis present

## 2018-08-11 LAB — CBC WITH DIFFERENTIAL/PLATELET
Abs Immature Granulocytes: 0.01 10*3/uL (ref 0.00–0.07)
Basophils Absolute: 0 10*3/uL (ref 0.0–0.1)
Basophils Relative: 1 %
Eosinophils Absolute: 0.1 10*3/uL (ref 0.0–0.5)
Eosinophils Relative: 2 %
HCT: 40.4 % (ref 36.0–46.0)
Hemoglobin: 12.9 g/dL (ref 12.0–15.0)
Immature Granulocytes: 0 %
Lymphocytes Relative: 25 %
Lymphs Abs: 1.2 10*3/uL (ref 0.7–4.0)
MCH: 28.9 pg (ref 26.0–34.0)
MCHC: 31.9 g/dL (ref 30.0–36.0)
MCV: 90.6 fL (ref 80.0–100.0)
Monocytes Absolute: 0.4 10*3/uL (ref 0.1–1.0)
Monocytes Relative: 9 %
Neutro Abs: 3 10*3/uL (ref 1.7–7.7)
Neutrophils Relative %: 63 %
Platelets: 235 10*3/uL (ref 150–400)
RBC: 4.46 MIL/uL (ref 3.87–5.11)
RDW: 13.2 % (ref 11.5–15.5)
WBC: 4.8 10*3/uL (ref 4.0–10.5)
nRBC: 0 % (ref 0.0–0.2)

## 2018-08-11 LAB — COMPREHENSIVE METABOLIC PANEL
ALT: 15 U/L (ref 0–44)
AST: 20 U/L (ref 15–41)
Albumin: 4.5 g/dL (ref 3.5–5.0)
Alkaline Phosphatase: 57 U/L (ref 38–126)
Anion gap: 9 (ref 5–15)
BUN: 6 mg/dL (ref 6–20)
CO2: 24 mmol/L (ref 22–32)
Calcium: 9.4 mg/dL (ref 8.9–10.3)
Chloride: 106 mmol/L (ref 98–111)
Creatinine, Ser: 0.82 mg/dL (ref 0.44–1.00)
GFR calc Af Amer: >60 mL/min (ref >60)
GFR calc non Af Amer: >60 mL/min (ref >60)
Glucose, Bld: 110 mg/dL — ABNORMAL HIGH (ref 70–99)
Potassium: 3.5 mmol/L (ref 3.5–5.1)
Sodium: 139 mmol/L (ref 135–145)
Total Bilirubin: 0.5 mg/dL (ref 0.3–1.2)
Total Protein: 7.4 g/dL (ref 6.5–8.1)

## 2018-08-11 LAB — LIPASE, BLOOD: Lipase: 35 U/L (ref 11–51)

## 2018-08-11 MED ORDER — PROMETHAZINE HCL 25 MG/ML IJ SOLN
25.0000 mg | Freq: Once | INTRAMUSCULAR | Status: AC
  Administered 2018-08-11: 25 mg via INTRAVENOUS
  Filled 2018-08-11: qty 1

## 2018-08-11 MED ORDER — SODIUM CHLORIDE 0.9 % IV BOLUS
1000.0000 mL | Freq: Once | INTRAVENOUS | Status: AC
  Administered 2018-08-11: 1000 mL via INTRAVENOUS

## 2018-08-11 MED ORDER — PROMETHAZINE HCL 25 MG PO TABS: 25.0000 mg | ORAL_TABLET | Freq: Four times a day (QID) | ORAL | 0 refills | Status: DC | PRN

## 2018-08-11 NOTE — ED Triage Notes (Signed)
Pt arrives POV d/t Lower abd pain w/ N/V that started last Wednesday. [facility]

## 2018-08-11 NOTE — Discharge Instructions (Addendum)
Please read attached information. If you experience any new or worsening signs or symptoms please return to the emergency room for evaluation. Please follow-up with your primary care provider or specialist as discussed. Please use medication prescribed only as directed and discontinue taking if you have any concerning signs or symptoms.   °

## 2018-08-11 NOTE — ED Provider Notes (Addendum)
[facility] EMERGENCY DEPARTMENT Provider Note   CSN: [id number] Arrival date & time: 08/11/18  0848     History   Chief Complaint Chief Complaint  Patient presents with  . Abdominal Pain    HPI Emily Frazier is a 22 y.o. female.  HPI   22 year old female presents today with complaints of abdominal cramping.  Patient notes symptoms started approximately 5 days ago.  She notes episodes of vomiting and diarrhea.  She reports that she was seen at the [facility] on 08/11/2018 with reassuring work-up.  She was diagnosed with norovirus and discharged with Zofran.  She notes she has been using Zofran which did not improve her symptoms this morning, she notes 2 episodes of vomiting today, nonbloody.  She notes that originally she was having diarrhea, now feels somewhat constipated.  She notes the pain is crampy bilateral lower abdomen coming and going.  She denies any vaginal discharge but notes light vaginal spotting.  No fevers at home.  No close sick contacts.  Past Medical History:  Diagnosis Date  . Pregnancy induced hypertension     Patient Active Problem List   Diagnosis Date Noted  . Labor and delivery, indication for care 06/14/2018  . History of pre-eclampsia in prior pregnancy, currently pregnant 12/02/2017  . History of prior pregnancy with IUGR newborn 12/02/2017    Past Surgical History:  Procedure Laterality Date  . WISDOM TOOTH EXTRACTION       OB History    Gravida  3   Para  2   Term  2   Preterm  0   AB  1   Living  2     SAB  1   TAB  0   Ectopic  0   Multiple  0   Live Births  2            Home Medications    Prior to Admission medications   Medication Sig Start Date End Date Taking? Authorizing Provider  etonogestrel (NEXPLANON) 68 MG IMPL implant 1 each (68 mg total) by Subdermal route once for 1 dose. 06/14/18 06/14/18  Gwenevere Abbot, MD  ibuprofen (ADVIL,MOTRIN) 600 MG tablet Take 1 tablet (600  mg total) by mouth every 6 (six) hours as needed. 08/06/18   Rasch, Victorino Dike I, NP  ondansetron (ZOFRAN ODT) 8 MG disintegrating tablet Take 1 tablet (8 mg total) by mouth every 8 (eight) hours as needed for nausea or vomiting. 08/06/18   Rasch, Harolyn Rutherford, NP  promethazine (PHENERGAN) 25 MG tablet Take 1 tablet (25 mg total) by mouth every 6 (six) hours as needed for nausea or vomiting. 08/11/18   Eyvonne Mechanic, PA-C    Family History Family History  Problem Relation Age of Onset  . Cancer Sister 6  . Arthritis Neg Hx   . Diabetes Neg Hx   . Heart disease Neg Hx   . Hypertension Neg Hx   . Stroke Neg Hx     Social History Social History   Tobacco Use  . Smoking status: Former Smoker    Packs/day: 0.10    Last attempt to quit: 08/27/2016    Years since quitting: 1.9  . Smokeless tobacco: Never Used  Substance Use Topics  . Alcohol use: No  . Drug use: No     Allergies   Patient has no known allergies.   Review of Systems Review of Systems  All other systems reviewed and are negative.    Physical Exam  Updated Vital Signs BP 109/68   Pulse 77   Temp 98.3 F (36.8 C) (Oral)   Resp 12   LMP  (LMP Unknown)   SpO2 100%   Physical Exam Vitals signs and nursing note reviewed.  Constitutional:      Appearance: She is well-developed.  HENT:     Head: Normocephalic and atraumatic.  Eyes:     General: No scleral icterus.       Right eye: No discharge.        Left eye: No discharge.     Conjunctiva/sclera: Conjunctivae normal.     Pupils: Pupils are equal, round, and reactive to light.  Neck:     Musculoskeletal: Normal range of motion.     Vascular: No JVD.     Trachea: No tracheal deviation.  Pulmonary:     Effort: Pulmonary effort is normal.     Breath sounds: No stridor.  Abdominal:     Comments: Abdomen soft nontender no distention, no rebound guarding or masses  Neurological:     Mental Status: She is alert and oriented to person, place, and time.      Coordination: Coordination normal.  Psychiatric:        Behavior: Behavior normal.        Thought Content: Thought content normal.        Judgment: Judgment normal.     ED Treatments / Results  Labs (all labs ordered are listed, but only abnormal results are displayed) Labs Reviewed  COMPREHENSIVE METABOLIC PANEL - Abnormal; Notable for the following components:      Result Value   Glucose, Bld 110 (*)    All other components within normal limits  CBC WITH DIFFERENTIAL/PLATELET  LIPASE, BLOOD    EKG None  Radiology No results found.  Procedures Procedures (including critical care time)  Medications Ordered in ED Medications  promethazine (PHENERGAN) injection 25 mg (25 mg Intravenous Given 08/11/18 0924)  sodium chloride 0.9 % bolus 1,000 mL (0 mLs Intravenous Stopped 08/11/18 1024)     Initial Impression / Assessment and Plan / ED Course  I have reviewed the triage vital signs and the nursing notes.  Pertinent labs & imaging results that were available during my care of the patient were reviewed by me and considered in my medical decision making (see chart for details).     22 year old female presents today with likely viral gastroenteritis.  Patient had recent evaluation that was reassuring including negative pregnancy test.  She has a soft nontender abdomen is afebrile with no elevation in white count.  She is tolerating p.o. and will be discharged home with antiemetics.  She is not breast-feeding.  She will follow-up as an outpatient if symptoms persist return immediately if they worsen.  She verbalized understanding and agreement to today's plan had no further questions or concerns.  Final Clinical Impressions(s) / ED Diagnoses   Final diagnoses:  Viral gastroenteritis    ED Discharge Orders         Ordered    promethazine (PHENERGAN) 25 MG tablet  Every 6 hours PRN     08/11/18 1021           Eyvonne MechanicHedges, Excell Neyland, PA-C 08/11/18 1022    HedgesTinnie Gens,  Nikola Blackston, PA-C 08/11/18 1025    Rolan BuccoBelfi, Melanie, MD 08/11/18 [phone]

## 2018-08-14 ENCOUNTER — Encounter (HOSPITAL_COMMUNITY): Payer: Self-pay | Admitting: *Deleted

## 2018-08-14 ENCOUNTER — Emergency Department (HOSPITAL_COMMUNITY)
Admission: EM | Admit: 2018-08-14 | Discharge: 2018-08-14 | Disposition: A | Discharge status: Home / Self Care | Payer: BLUE CROSS/BLUE SHIELD | Attending: Emergency Medicine | Admitting: Emergency Medicine

## 2018-08-14 ENCOUNTER — Other Ambulatory Visit: Payer: Self-pay

## 2018-08-14 DIAGNOSIS — Z79899 Other long term (current) drug therapy: Secondary | ICD-10-CM | POA: Diagnosis not present

## 2018-08-14 DIAGNOSIS — R197 Diarrhea, unspecified: Secondary | ICD-10-CM

## 2018-08-14 DIAGNOSIS — Z87891 Personal history of nicotine dependence: Secondary | ICD-10-CM | POA: Insufficient documentation

## 2018-08-14 DIAGNOSIS — E876 Hypokalemia: Secondary | ICD-10-CM

## 2018-08-14 DIAGNOSIS — R112 Nausea with vomiting, unspecified: Secondary | ICD-10-CM | POA: Diagnosis present

## 2018-08-14 LAB — COMPREHENSIVE METABOLIC PANEL
ALT: 12 U/L (ref 0–44)
AST: 16 U/L (ref 15–41)
Albumin: 4.1 g/dL (ref 3.5–5.0)
Alkaline Phosphatase: 50 U/L (ref 38–126)
Anion gap: 9 (ref 5–15)
BUN: 5 mg/dL — ABNORMAL LOW (ref 6–20)
CO2: 24 mmol/L (ref 22–32)
Calcium: 8.7 mg/dL — ABNORMAL LOW (ref 8.9–10.3)
Chloride: 107 mmol/L (ref 98–111)
Creatinine, Ser: 0.68 mg/dL (ref 0.44–1.00)
GFR calc Af Amer: >60 mL/min (ref >60)
GFR calc non Af Amer: >60 mL/min (ref >60)
Glucose, Bld: 106 mg/dL — ABNORMAL HIGH (ref 70–99)
POTASSIUM: 3 mmol/L — AB (ref 3.5–5.1)
SODIUM: 140 mmol/L (ref 135–145)
Total Bilirubin: 0.6 mg/dL (ref 0.3–1.2)
Total Protein: 6.1 g/dL — ABNORMAL LOW (ref 6.5–8.1)

## 2018-08-14 LAB — CBC WITH DIFFERENTIAL/PLATELET
Abs Immature Granulocytes: 0.02 10*3/uL (ref 0.00–0.07)
Basophils Absolute: 0 10*3/uL (ref 0.0–0.1)
Basophils Relative: 0 %
EOS ABS: 0.3 10*3/uL (ref 0.0–0.5)
Eosinophils Relative: 4 %
HCT: 35.8 % — ABNORMAL LOW (ref 36.0–46.0)
Hemoglobin: 11.5 g/dL — ABNORMAL LOW (ref 12.0–15.0)
Immature Granulocytes: 0 %
Lymphocytes Relative: 24 %
Lymphs Abs: 1.9 10*3/uL (ref 0.7–4.0)
MCH: 29.3 pg (ref 26.0–34.0)
MCHC: 32.1 g/dL (ref 30.0–36.0)
MCV: 91.1 fL (ref 80.0–100.0)
Monocytes Absolute: 0.7 10*3/uL (ref 0.1–1.0)
Monocytes Relative: 9 %
Neutro Abs: 4.9 10*3/uL (ref 1.7–7.7)
Neutrophils Relative %: 63 %
Platelets: 215 10*3/uL (ref 150–400)
RBC: 3.93 MIL/uL (ref 3.87–5.11)
RDW: 13.2 % (ref 11.5–15.5)
WBC: 7.9 10*3/uL (ref 4.0–10.5)
nRBC: 0 % (ref 0.0–0.2)

## 2018-08-14 LAB — URINALYSIS, ROUTINE W REFLEX MICROSCOPIC
Bilirubin Urine: NEGATIVE
Glucose, UA: NEGATIVE mg/dL
Ketones, ur: 20 mg/dL — AB
Leukocytes, UA: NEGATIVE
Nitrite: NEGATIVE
Protein, ur: 100 mg/dL — AB
RBC / HPF: >50 RBC/hpf — ABNORMAL HIGH (ref 0–5)
Specific Gravity, Urine: 1.021 (ref 1.005–1.030)
pH: 6 (ref 5.0–8.0)

## 2018-08-14 LAB — I-STAT BETA HCG BLOOD, ED (MC, WL, AP ONLY): I-stat hCG, quantitative: <5 m[IU]/mL (ref <5)

## 2018-08-14 LAB — LIPASE, BLOOD: Lipase: 40 U/L (ref 11–51)

## 2018-08-14 MED ORDER — POTASSIUM CHLORIDE CRYS ER 20 MEQ PO TBCR: 20.0000 meq | EXTENDED_RELEASE_TABLET | Freq: Two times a day (BID) | ORAL | 0 refills | Status: DC

## 2018-08-14 MED ORDER — ONDANSETRON HCL 4 MG/2ML IJ SOLN
4.0000 mg | Freq: Once | INTRAMUSCULAR | Status: AC
  Administered 2018-08-14: 4 mg via INTRAVENOUS
  Filled 2018-08-14: qty 2

## 2018-08-14 MED ORDER — METOCLOPRAMIDE HCL 10 MG PO TABS: 10.0000 mg | ORAL_TABLET | Freq: Four times a day (QID) | ORAL | 0 refills | Status: DC | PRN

## 2018-08-14 MED ORDER — SODIUM CHLORIDE 0.9 % IV BOLUS (SEPSIS)
1000.0000 mL | Freq: Once | INTRAVENOUS | Status: AC
  Administered 2018-08-14: 1000 mL via INTRAVENOUS

## 2018-08-14 MED ORDER — POTASSIUM CHLORIDE CRYS ER 20 MEQ PO TBCR
40.0000 meq | EXTENDED_RELEASE_TABLET | Freq: Once | ORAL | Status: AC
  Administered 2018-08-14: 40 meq via ORAL
  Filled 2018-08-14: qty 2

## 2018-08-14 NOTE — ED Notes (Signed)
Reviewed d/c instructions with pt, who verbalized understanding and had no outstanding questions. Pt departed in NAD, refused use of wheelchair.   

## 2018-08-14 NOTE — ED Triage Notes (Signed)
Pt c/o vomiting and diarrhea for over a week. She has been seen twice for the same. Reports abdominal pain has improved but still having vomiting and diarrhea. Last phenergan about 1 hour ago without relief.

## 2018-08-14 NOTE — ED Notes (Signed)
ED Provider at bedside. 

## 2018-08-14 NOTE — Discharge Instructions (Addendum)
You may continue to alternate between Zofran and promethazine for nausea and vomiting.  You may use over-the-counter Imodium as needed for diarrhea.  Please stop using MiraLAX.   Your potassium level today was slightly low at 3.0.  We are discharging you home with potassium replacement to replenish your potassium level which is likely low from frequent vomiting and diarrhea.   Steps to find a Primary Care Provider (PCP):  Call [phone] or [phone] to access "[facility] Find a Doctor Service."  2.  You may also go on the [facility] website at [email or web address]  3.  [facility] also frequently accepts new patients.  [facility]  [address] [place] [id number] [phone]  4.  There are also multiple [facility], [facility], [place] and [facility] practices throughout the [place] that are frequently accepting new patients. You may find a clinic that is close to your home and contact them.  [facility] [email or web address] [phone]  [facility] [email or web address]  [facility] [email or web address] [phone]  [email or web address] [phone]  5.  Local Health Departments also can provide primary care services.  [facility]  [address] [place] [address] [phone]  [facility] [address] [place] [address] [phone]  [facility] [address] [place] [address]  [place] [phone]

## 2018-08-14 NOTE — ED Provider Notes (Signed)
TIME SEEN: 3:16 AM  CHIEF COMPLAINT: Nausea, vomiting, diarrhea  HPI: Patient is a 22 year old female with no significant past medical history who had normal spontaneous vaginal delivery October 19 who presents the emergency department with nausea, vomiting and diarrhea for the past 8 days.  [facility] for vomiting and thought she could be pregnant.  Pregnancy test at that time was negative.  Having abnormal menstrual cycles but just recently had Nexplanon placed.  Discharged with Zofran.  Was [facility] on 08/11/2018 for continued vomiting and diarrhea.  Reports she was discharged with promethazine at that time.  Was having abdominal cramping with states abdominal pain has completely resolved.  States in the past 24 hours she has vomited approximately 15 times.  Still having liquid stools without blood or melena.  No fever.  No prior abdominal surgery.  No dysuria, hematuria, vaginal discharge.  Currently on her menstrual cycle.  No recent travel.  No known bad food exposure.  No sick contacts.  ROS: See HPI Constitutional: no fever  Eyes: no drainage  ENT: no runny nose   Cardiovascular:  no chest pain  Resp: no SOB  GI: Vomiting and diarrhea GU: no dysuria Integumentary: no rash  Allergy: no hives  Musculoskeletal: no leg swelling  Neurological: no slurred speech ROS otherwise negative  PAST MEDICAL HISTORY/PAST SURGICAL HISTORY:  Past Medical History:  Diagnosis Date  . Pregnancy induced hypertension     MEDICATIONS:  Prior to Admission medications   Medication Sig Start Date End Date Taking? Authorizing Provider  etonogestrel (NEXPLANON) 68 MG IMPL implant 1 each (68 mg total) by Subdermal route once for 1 dose. 06/14/18 06/14/18  Gwenevere AbbotPhillip, Nimeka, MD  ibuprofen (ADVIL,MOTRIN) 600 MG tablet Take 1 tablet (600 mg total) by mouth every 6 (six) hours as needed. 08/06/18   Rasch, Victorino DikeJennifer I, NP  ondansetron (ZOFRAN ODT) 8 MG  disintegrating tablet Take 1 tablet (8 mg total) by mouth every 8 (eight) hours as needed for nausea or vomiting. 08/06/18   Rasch, Harolyn RutherfordJennifer I, NP  promethazine (PHENERGAN) 25 MG tablet Take 1 tablet (25 mg total) by mouth every 6 (six) hours as needed for nausea or vomiting. 08/11/18   Hedges, Tinnie GensJeffrey, PA-C    ALLERGIES:  No Known Allergies  SOCIAL HISTORY:  Social History   Tobacco Use  . Smoking status: Former Smoker    Packs/day: 0.10    Last attempt to quit: 08/27/2016    Years since quitting: 1.9  . Smokeless tobacco: Never Used  Substance Use Topics  . Alcohol use: No    FAMILY HISTORY: Family History  Problem Relation Age of Onset  . Cancer Sister 6  . Arthritis Neg Hx   . Diabetes Neg Hx   . Heart disease Neg Hx   . Hypertension Neg Hx   . Stroke Neg Hx     EXAM: BP 117/81 (BP Location: Right Arm)   Pulse 63   Temp 98.1 F (36.7 C)   Resp 16   LMP 08/07/2018   SpO2 99%  CONSTITUTIONAL: Alert and oriented and responds appropriately to questions. Well-appearing; well-nourished HEAD: Normocephalic EYES: Conjunctivae clear, pupils appear equal, EOMI ENT: normal nose; slightly dry mucous membranes NECK: Supple, no meningismus, no nuchal rigidity, no LAD  CARD: RRR; S1 and S2 appreciated; no murmurs, no clicks, no rubs, no gallops RESP: Normal chest excursion without splinting or tachypnea; breath sounds clear and equal bilaterally; no wheezes, no  rhonchi, no rales, no hypoxia or respiratory distress, speaking full sentences ABD/GI: Normal bowel sounds; non-distended; soft, non-tender, no rebound, no guarding, no peritoneal signs, no hepatosplenomegaly BACK:  The back appears normal and is non-tender to palpation, there is no CVA tenderness EXT: Normal ROM in all joints; non-tender to palpation; no edema; normal capillary refill; no cyanosis, no calf tenderness or swelling    SKIN: Normal color for age and race; warm; no rash NEURO: Moves all extremities  equally PSYCH: The patient's mood and manner are appropriate. Grooming and personal hygiene are appropriate.  MEDICAL DECISION MAKING: Patient here with vomiting and diarrhea.  Abdominal exam completely benign.  Afebrile.  Doubt appendicitis, colitis, diverticulitis, bowel obstruction, cholecystitis, pancreatitis based on her exam.  Could be prolonged viral illness.  Will repeat labs, urine today.  We will treat symptomatically with IV fluids, Zofran.  She does not appear significantly dehydrated on examination.  I do not feel she needs emergent imaging of her abdomen and she agrees with this plan.  ED PROGRESS: 5:15 AM  Pt's labs unremarkable other than slightly low potassium of 3.0.  Given oral replacement.  Reports feeling better and able to drink without difficulty here.  No further vomiting or diarrhea.  Awaiting urinalysis.   6:35 AM  Pt has not had any further vomiting here in the emergency department.  Her urine shows small amount of ketones and she has received IV fluids here.  She does have blood and many bacteria but also squamous cells.  Bleeding likely from menstrual cycle.  Doubt UTI.  She is requesting something "stronger" for her nausea to go home with.  Will discharge with Reglan.  Discussed with her that she can alternate this with Zofran and Phenergan.  Recommended bland diet.  She now tells me that she has been taking MiraLAX as well which I think is likely contributing to why she continues to feel poorly he had have nausea, abdominal cramping intermittently and diarrhea.  Have advised her to stop MiraLAX.  Have advised her to use over-the-counter Imodium instead for diarrhea.  Patient verbalized understanding.   At this time, I do not feel there is any life-threatening condition present. I have reviewed and discussed all results (EKG, imaging, lab, urine as appropriate) and exam findings with patient/family. I have reviewed nursing notes and appropriate previous records.  I feel the  patient is safe to be discharged home without further emergent workup and can continue workup as an outpatient as needed. Discussed usual and customary return precautions. Patient/family verbalize understanding and are comfortable with this plan.  Outpatient follow-up has been provided as needed. All questions have been answered.      , Layla MawKristen N, DO 08/14/18 [phone]

## 2018-08-30 ENCOUNTER — Emergency Department (HOSPITAL_COMMUNITY)
Admission: EM | Admit: 2018-08-30 | Discharge: 2018-08-30 | Disposition: A | Discharge status: Home / Self Care | Payer: BLUE CROSS/BLUE SHIELD | Attending: Emergency Medicine | Admitting: Emergency Medicine

## 2018-08-30 ENCOUNTER — Other Ambulatory Visit: Payer: Self-pay

## 2018-08-30 ENCOUNTER — Encounter (HOSPITAL_COMMUNITY): Payer: Self-pay

## 2018-08-30 DIAGNOSIS — R1110 Vomiting, unspecified: Secondary | ICD-10-CM | POA: Diagnosis present

## 2018-08-30 DIAGNOSIS — Z5321 Procedure and treatment not carried out due to patient leaving prior to being seen by health care provider: Secondary | ICD-10-CM | POA: Insufficient documentation

## 2018-08-30 DIAGNOSIS — F1010 Alcohol abuse, uncomplicated: Secondary | ICD-10-CM | POA: Diagnosis not present

## 2018-08-30 LAB — CBC
HCT: 37.8 % (ref 36.0–46.0)
Hemoglobin: 12.4 g/dL (ref 12.0–15.0)
MCH: 30 pg (ref 26.0–34.0)
MCHC: 32.8 g/dL (ref 30.0–36.0)
MCV: 91.3 fL (ref 80.0–100.0)
Platelets: 281 10*3/uL (ref 150–400)
RBC: 4.14 MIL/uL (ref 3.87–5.11)
RDW: 13.7 % (ref 11.5–15.5)
WBC: 12 10*3/uL — ABNORMAL HIGH (ref 4.0–10.5)
nRBC: 0 % (ref 0.0–0.2)

## 2018-08-30 LAB — I-STAT BETA HCG BLOOD, ED (MC, WL, AP ONLY): I-stat hCG, quantitative: <5 m[IU]/mL (ref <5)

## 2018-08-30 LAB — URINALYSIS, ROUTINE W REFLEX MICROSCOPIC
Bilirubin Urine: NEGATIVE
Glucose, UA: NEGATIVE mg/dL
Hgb urine dipstick: NEGATIVE
Ketones, ur: 40 mg/dL — AB
Leukocytes, UA: NEGATIVE
Nitrite: NEGATIVE
Specific Gravity, Urine: >1.03 — ABNORMAL HIGH (ref 1.005–1.030)
pH: 6 (ref 5.0–8.0)

## 2018-08-30 LAB — COMPREHENSIVE METABOLIC PANEL
ALT: 17 U/L (ref 0–44)
AST: 22 U/L (ref 15–41)
Albumin: 4.8 g/dL (ref 3.5–5.0)
Alkaline Phosphatase: 54 U/L (ref 38–126)
Anion gap: 13 (ref 5–15)
BUN: 9 mg/dL (ref 6–20)
CO2: 20 mmol/L — ABNORMAL LOW (ref 22–32)
Calcium: 9.4 mg/dL (ref 8.9–10.3)
Chloride: 108 mmol/L (ref 98–111)
Creatinine, Ser: 0.58 mg/dL (ref 0.44–1.00)
GFR calc Af Amer: >60 mL/min (ref >60)
GFR calc non Af Amer: >60 mL/min (ref >60)
Glucose, Bld: 111 mg/dL — ABNORMAL HIGH (ref 70–99)
Potassium: 3.8 mmol/L (ref 3.5–5.1)
Sodium: 141 mmol/L (ref 135–145)
Total Bilirubin: 0.5 mg/dL (ref 0.3–1.2)
Total Protein: 7.7 g/dL (ref 6.5–8.1)

## 2018-08-30 LAB — URINALYSIS, MICROSCOPIC (REFLEX)

## 2018-08-30 LAB — RAPID URINE DRUG SCREEN, HOSP PERFORMED
Amphetamines: NOT DETECTED
Barbiturates: NOT DETECTED
Benzodiazepines: NOT DETECTED
Cocaine: NOT DETECTED
Opiates: NOT DETECTED
Tetrahydrocannabinol: POSITIVE — AB

## 2018-08-30 LAB — LIPASE, BLOOD: Lipase: 35 U/L (ref 11–51)

## 2018-08-30 MED ORDER — ONDANSETRON HCL 4 MG/2ML IJ SOLN
4.0000 mg | Freq: Once | INTRAMUSCULAR | Status: DC
  Filled 2018-08-30: qty 2

## 2018-08-30 NOTE — ED Notes (Signed)
Blood draw attempt x2 unsuccesful bc patient kept moving

## 2018-08-30 NOTE — ED Triage Notes (Signed)
Pt reports that she drank at least a fifth of [place]iro[place] around 7p and started vomiting at 9p. She states that she has alcohol poisoning. Denies diarrhea. Endorses generalized abdominal- no distress noted. Last episode of vomiting was around 2a.

## 2018-08-30 NOTE — ED Notes (Signed)
Patient stated she could not wait any longer and left.

## 2019-01-26 ENCOUNTER — Encounter (HOSPITAL_COMMUNITY): Payer: Self-pay | Admitting: Family Medicine

## 2019-01-26 ENCOUNTER — Emergency Department (HOSPITAL_COMMUNITY)
Admission: EM | Admit: 2019-01-26 | Discharge: 2019-01-27 | Disposition: A | Discharge status: Home / Self Care | Payer: BLUE CROSS/BLUE SHIELD | Attending: Emergency Medicine | Admitting: Emergency Medicine

## 2019-01-26 ENCOUNTER — Emergency Department (HOSPITAL_COMMUNITY): Payer: BLUE CROSS/BLUE SHIELD

## 2019-01-26 ENCOUNTER — Other Ambulatory Visit: Payer: Self-pay

## 2019-01-26 DIAGNOSIS — X501XXA Overexertion from prolonged static or awkward postures, initial encounter: Secondary | ICD-10-CM | POA: Diagnosis not present

## 2019-01-26 DIAGNOSIS — Z87891 Personal history of nicotine dependence: Secondary | ICD-10-CM | POA: Diagnosis not present

## 2019-01-26 DIAGNOSIS — Y929 Unspecified place or not applicable: Secondary | ICD-10-CM | POA: Insufficient documentation

## 2019-01-26 DIAGNOSIS — W010XXA Fall on same level from slipping, tripping and stumbling without subsequent striking against object, initial encounter: Secondary | ICD-10-CM | POA: Diagnosis not present

## 2019-01-26 DIAGNOSIS — Y9302 Activity, running: Secondary | ICD-10-CM | POA: Insufficient documentation

## 2019-01-26 DIAGNOSIS — S99912A Unspecified injury of left ankle, initial encounter: Secondary | ICD-10-CM | POA: Insufficient documentation

## 2019-01-26 DIAGNOSIS — Z79899 Other long term (current) drug therapy: Secondary | ICD-10-CM | POA: Insufficient documentation

## 2019-01-26 DIAGNOSIS — Y999 Unspecified external cause status: Secondary | ICD-10-CM | POA: Diagnosis not present

## 2019-01-26 DIAGNOSIS — M25572 Pain in left ankle and joints of left foot: Secondary | ICD-10-CM | POA: Diagnosis present

## 2019-01-26 NOTE — ED Notes (Signed)
Bed: WTR5 Expected date:  Expected time:  Means of arrival:  Comments: 

## 2019-01-26 NOTE — ED Triage Notes (Signed)
Patient states earlier today while going on steps, she missed a step and landed on the lateral side of her left ankle. Patient states she was able to complete her shift. However, when she got home from work, she was unable to ambulate. Patient reports her pain radiates up her lower leg. Strong pedal pulse and cap refill less than 2 seconds.

## 2019-01-26 NOTE — ED Provider Notes (Signed)
[facility]-EMERGENCY DEPT Provider Note   CSN: [id number] Arrival date & time: 01/26/19  1904  History   Chief Complaint Chief Complaint  Patient presents with  . Ankle Injury    HPI Emily Frazier is a 23 y.o. female who presents to the ED s/p mechanical fall this afternoon w/ complaints of L ankle pain/swelling. Patient states she was jogging & missed the last step resulting in L ankle inversion & fall to the ground. No head injury or LOC.  Initially ambulatory & able to weight bear but throughout the night increased pain/swelling to the ankle/foot. Pain is moderate, severe w/ movement. NO meds PTA. Denies numbness, tingling, or weakness. Denies chance of pregnancy.     HPI  Past Medical History:  Diagnosis Date  . Pregnancy induced hypertension     Patient Active Problem List   Diagnosis Date Noted  . Labor and delivery, indication for care 06/14/2018  . History of pre-eclampsia in prior pregnancy, currently pregnant 12/02/2017  . History of prior pregnancy with IUGR newborn 12/02/2017    Past Surgical History:  Procedure Laterality Date  . WISDOM TOOTH EXTRACTION       OB History    Gravida  3   Para  2   Term  2   Preterm  0   AB  1   Living  2     SAB  1   TAB  0   Ectopic  0   Multiple  0   Live Births  2            Home Medications    Prior to Admission medications   Medication Sig Start Date End Date Taking? Authorizing Provider  etonogestrel (NEXPLANON) 68 MG IMPL implant 1 each (68 mg total) by Subdermal route once for 1 dose. 06/14/18 08/14/18  Gwenevere Abbot, MD  ibuprofen (ADVIL,MOTRIN) 600 MG tablet Take 1 tablet (600 mg total) by mouth every 6 (six) hours as needed. Patient taking differently: Take 600 mg by mouth every 6 (six) hours as needed for moderate pain.  08/06/18   Rasch, Victorino Dike I, NP  metoCLOPramide (REGLAN) 10 MG tablet Take 1 tablet (10 mg total) by mouth every 6 (six) hours as needed for  nausea. 08/14/18   Ward, Layla Maw, DO  ondansetron (ZOFRAN ODT) 8 MG disintegrating tablet Take 1 tablet (8 mg total) by mouth every 8 (eight) hours as needed for nausea or vomiting. Patient not taking: Reported on 08/14/2018 08/06/18   Rasch, Victorino Dike I, NP  potassium chloride SA (K-DUR,KLOR-CON) 20 MEQ tablet Take 1 tablet (20 mEq total) by mouth 2 (two) times daily. 08/14/18   Ward, Layla Maw, DO  promethazine (PHENERGAN) 25 MG tablet Take 1 tablet (25 mg total) by mouth every 6 (six) hours as needed for nausea or vomiting. 08/11/18   Eyvonne Mechanic, PA-C    Family History Family History  Problem Relation Age of Onset  . Cancer Sister 6  . Arthritis Neg Hx   . Diabetes Neg Hx   . Heart disease Neg Hx   . Hypertension Neg Hx   . Stroke Neg Hx     Social History Social History   Tobacco Use  . Smoking status: Former Smoker    Packs/day: 0.10    Last attempt to quit: 08/27/2016    Years since quitting: 2.4  . Smokeless tobacco: Never Used  Substance Use Topics  . Alcohol use: No  . Drug use: No  Allergies   Patient has no known allergies.   Review of Systems Review of Systems  Constitutional: Negative for chills and fever.  Respiratory: Negative for shortness of breath.   Cardiovascular: Negative for chest pain.  Gastrointestinal: Negative for vomiting.  Musculoskeletal: Positive for arthralgias and joint swelling. Negative for back pain and neck pain.  Neurological: Negative for weakness and numbness.     Physical Exam Updated Vital Signs BP 108/71 (BP Location: Right Arm)   Pulse 78   Temp 98.5 F (36.9 C) (Oral)   Resp 16   Ht 5\' 6"  (1.676 m)   Wt 54.4 kg   LMP 01/12/2019   SpO2 100%   BMI 19.37 kg/m   Physical Exam Vitals signs and nursing note reviewed.  Constitutional:      General: She is not in acute distress.    Appearance: She is not ill-appearing or toxic-appearing.  HENT:     Head: Normocephalic and atraumatic.     Comments: No  racoon eyes or battle sign.  Neck:     Musculoskeletal: Normal range of motion.     Comments: No midline cervical spine tenderness.  Cardiovascular:     Rate and Rhythm: Normal rate.     Pulses:          Dorsalis pedis pulses are 2+ on the right side and 2+ on the left side.       Posterior tibial pulses are 2+ on the right side and 2+ on the left side.  Pulmonary:     Effort: Pulmonary effort is normal.  Chest:     Chest wall: No tenderness.  Abdominal:     General: There is no distension.     Palpations: Abdomen is soft.     Tenderness: There is no abdominal tenderness.  Musculoskeletal:     Comments: Back: No midline tenderness to palpation.  Upper extremities: Normal AROM. Nontender.  Lower extremities: Soft tissue swelling w/ mild bruising to L lateral ankle and mid foot. No open wounds. No obvious deformity.  Patient has intact AROM to bilateral hips, knees, ankles, and all digits. Tender to palpation to the L lateral malleolus, lateral ankle ligaments, medial malleolus, base of the 5th and diffuse mid foot. LEs are otherwise nontender. No fibular head tenderness.   Skin:    General: Skin is warm and dry.     Capillary Refill: Capillary refill takes less than 2 seconds.  Neurological:     Mental Status: She is alert.     Comments: Alert. Clear speech. Sensation grossly intact to bilateral lower extremities. 5/5 strength with plantar/dorsiflexion bilaterally.  Psychiatric:        Mood and Affect: Mood normal.        Behavior: Behavior normal.    ED Treatments / Results  Labs (all labs ordered are listed, but only abnormal results are displayed) Labs Reviewed - No data to display  EKG None  Radiology Dg Ankle Complete Left  Result Date: 01/26/2019 CLINICAL DATA:  Left ankle injury today. Missed a step. Unable to ambulate. EXAM: LEFT ANKLE COMPLETE - 3+ VIEW COMPARISON:  None. FINDINGS: There is no evidence of fracture, dislocation, or joint effusion. There is no evidence  of arthropathy or other focal bone abnormality. Soft tissues are unremarkable. IMPRESSION: Negative. Electronically Signed   By: Sebastian AcheAllen  Grady M.D.   On: 01/26/2019 21:28   Dg Foot Complete Left  Result Date: 01/27/2019 CLINICAL DATA:  23 year old female with trauma to the left foot. EXAM: LEFT  FOOT - COMPLETE 3+ VIEW COMPARISON:  Left ankle radiograph dated 01/26/2019 FINDINGS: There is no evidence of fracture or dislocation. There is no evidence of arthropathy or other focal bone abnormality. Soft tissues are unremarkable. IMPRESSION: Negative. Electronically Signed   By: Elgie Collard M.D.   On: 01/27/2019 00:02    Procedures Procedures (including critical care time)  SPLINT APPLICATION Date/Time: 11:31 PM Authorized by: Harvie Heck Consent: Verbal consent obtained. Risks and benefits: risks, benefits and alternatives were discussed Consent given by: patient Splint applied by: orthopedic technician Location details: LLE Splint type: ASO Supplies used: ASO Post-procedure: The splinted body part was neurovascularly unchanged following the procedure. Patient tolerance: Patient tolerated the procedure well with no immediate complications.  Medications Ordered in ED Medications - No data to display   Initial Impression / Assessment and Plan / ED Course  I have reviewed the triage vital signs and the nursing notes.  Pertinent labs & imaging results that were available during my care of the patient were reviewed by me and considered in my medical decision making (see chart for details).   Patient presents to the emergency department status post mechanical fall with left ankle/foot pain and swelling.  Nontoxic-appearing, no apparent distress, vitals WNL.  No evidence of serious head, neck, or back injury.  No chest or abdominal tenderness.  Appears to have isolated injury to the left ankle/foot.  Mild swelling and bruising noted.  No obvious deformity or open wounds.  Range of  motion intact.  NVI distally. X-rays negative for fx/dislocation.   Therapeutic splint/crutches. Weight bear as tolerated. PRICE. Naproxen.  I discussed results, treatment plan, need for follow-up, and return precautions with the patient. Provided opportunity for questions, patient confirmed understanding and is in agreement with plan.   Final Clinical Impressions(s) / ED Diagnoses   Final diagnoses:  Injury of left ankle, initial encounter    ED Discharge Orders         Ordered    naproxen (NAPROSYN) 500 MG tablet  2 times daily     01/27/19 0006           Charnee Turnipseed, [place] R, PA-C 01/27/19 0007    Virgina Norfolk, DO 01/27/19 1231

## 2019-01-27 MED ORDER — NAPROXEN 500 MG PO TABS: 500.0000 mg | ORAL_TABLET | Freq: Two times a day (BID) | ORAL | 0 refills | Status: DC

## 2019-01-27 NOTE — Discharge Instructions (Addendum)
Please read and follow all provided instructions.  You have been seen today for an injury to your left ankle/foot. X-rays were negative. Weight bear as tolerated.   Tests performed today include: An x-ray of the affected area - does NOT show any broken bones or dislocations.  Vital signs. See below for your results today.   Home care instructions: -- *PRICE in the first 24-48 hours after injury: Protect (with brace, splint, sling), if given by your provider Rest Ice- Do not apply ice pack directly to your skin, place towel or similar between your skin and ice/ice pack. Apply ice for 20 min, then remove for 40 min while awake Compression- Wear brace, elastic bandage, splint as directed by your provider Elevate affected extremity above the level of your heart when not walking around for the first 24-48 hours   Medications:  - Naproxen is a nonsteroidal anti-inflammatory medication that will help with pain and swelling. Be sure to take this medication as prescribed with food, 1 pill every 12 hours,  It should be taken with food, as it can cause stomach upset, and more seriously, stomach bleeding. Do not take other nonsteroidal anti-inflammatory medications with this such as Advil, Motrin, Aleve, Mobic, Goodie Powder, or Motrin.   You make take Tylenol per over the counter dosing with these medications.   We have prescribed you new medication(s) today. Discuss the medications prescribed today with your pharmacist as they can have adverse effects and interactions with your other medicines including over the counter and prescribed medications. Seek medical evaluation if you start to experience new or abnormal symptoms after taking one of these medicines, seek care immediately if you start to experience difficulty breathing, feeling of your throat closing, facial swelling, or rash as these could be indications of a more serious allergic reaction   Follow-up instructions: Please follow-up with your  primary care provider or the provided orthopedic physician (bone specialist) if you continue to have significant pain in 1 week. In this case you may have a more severe injury that requires further care.   Return instructions:  Please return if your digits or extremity are numb or tingling, appear gray or blue, or you have severe pain (also elevate the extremity and loosen splint or wrap if you were given one) Please return if you have redness or fevers.  Please return to the Emergency Department if you experience worsening symptoms.  Please return if you have any other emergent concerns. Additional Information:  Your vital signs today were: BP 108/71 (BP Location: Right Arm)    Pulse 78    Temp 98.5 F (36.9 C) (Oral)    Resp 16    Ht 5\' 6"  (1.676 m)    Wt 54.4 kg    LMP 01/12/2019    SpO2 100%    BMI 19.37 kg/m  If your blood pressure (BP) was elevated above 135/85 this visit, please have this repeated by your doctor within one month. ---------------

## 2019-02-20 IMAGING — CR DG HAND COMPLETE 3+V*R*
3 series · 3 of 3 positions shown · non-contrast
Comparison: None.

CLINICAL DATA: Initial encounter for Jammed right hand on cabinet
earlier today; Pain is mostly in 4th digit, ring finger, but pain
also extends through entire hand

EXAM:
RIGHT HAND - COMPLETE 3+ VIEW

[hand pa]
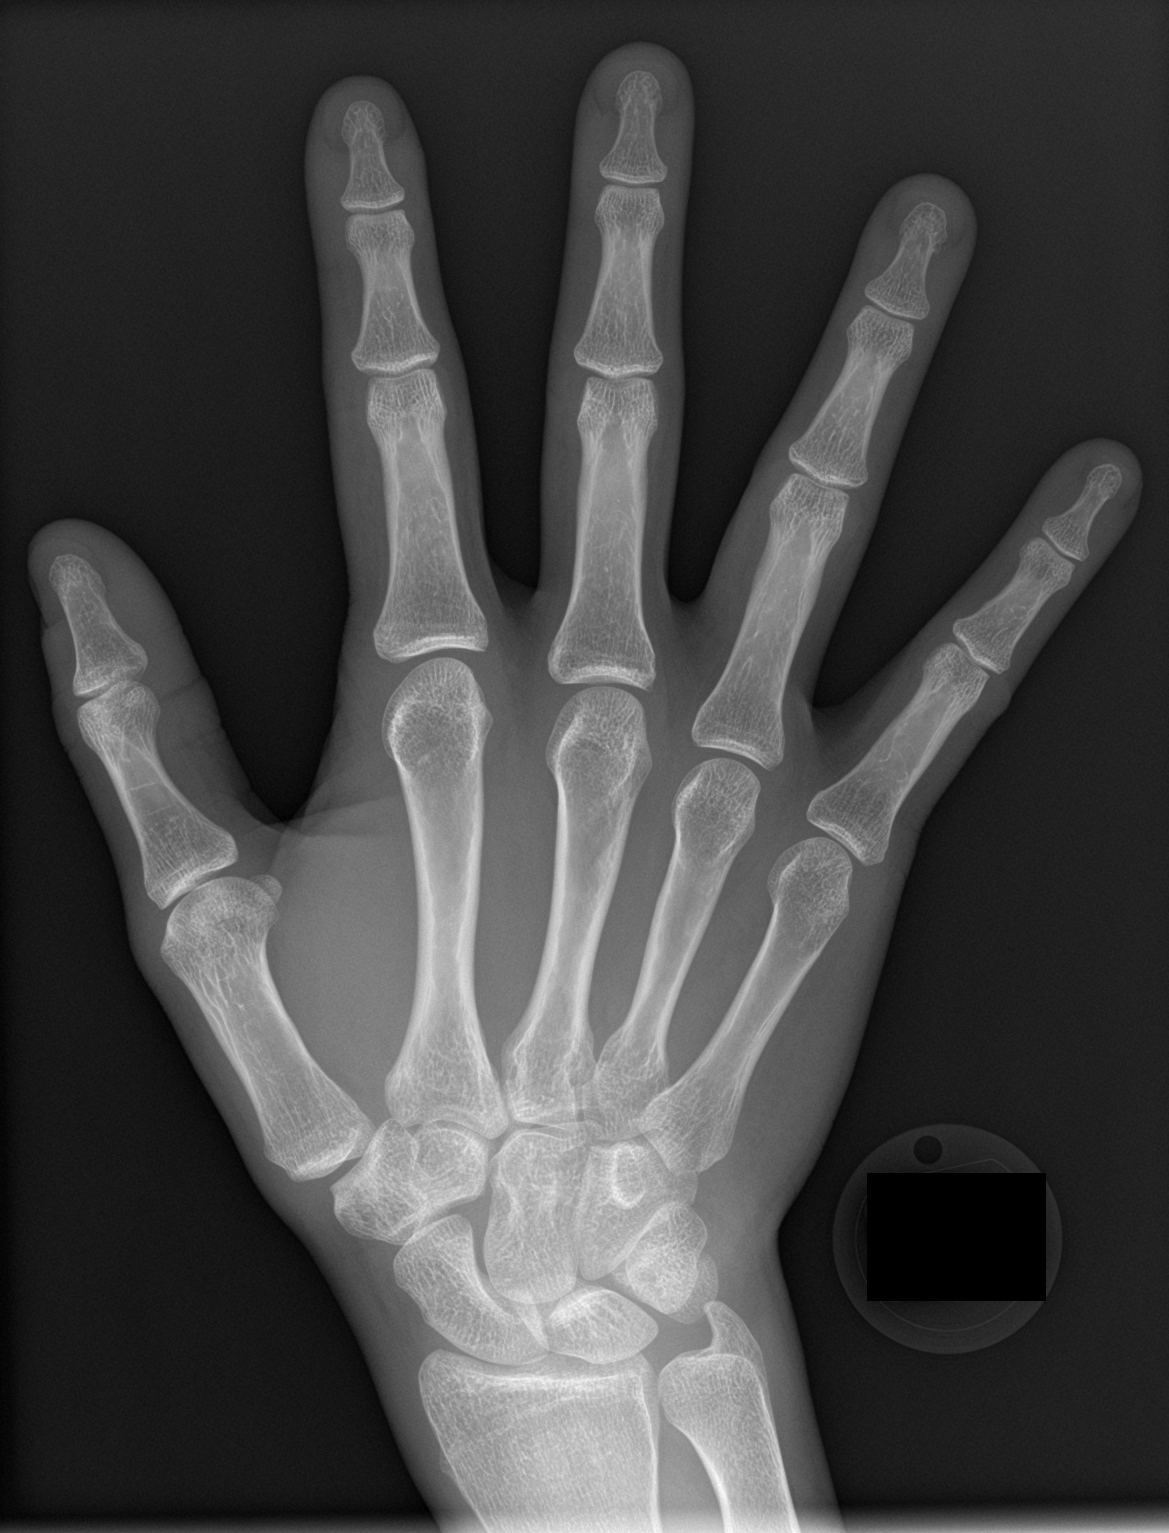

[hand obl]
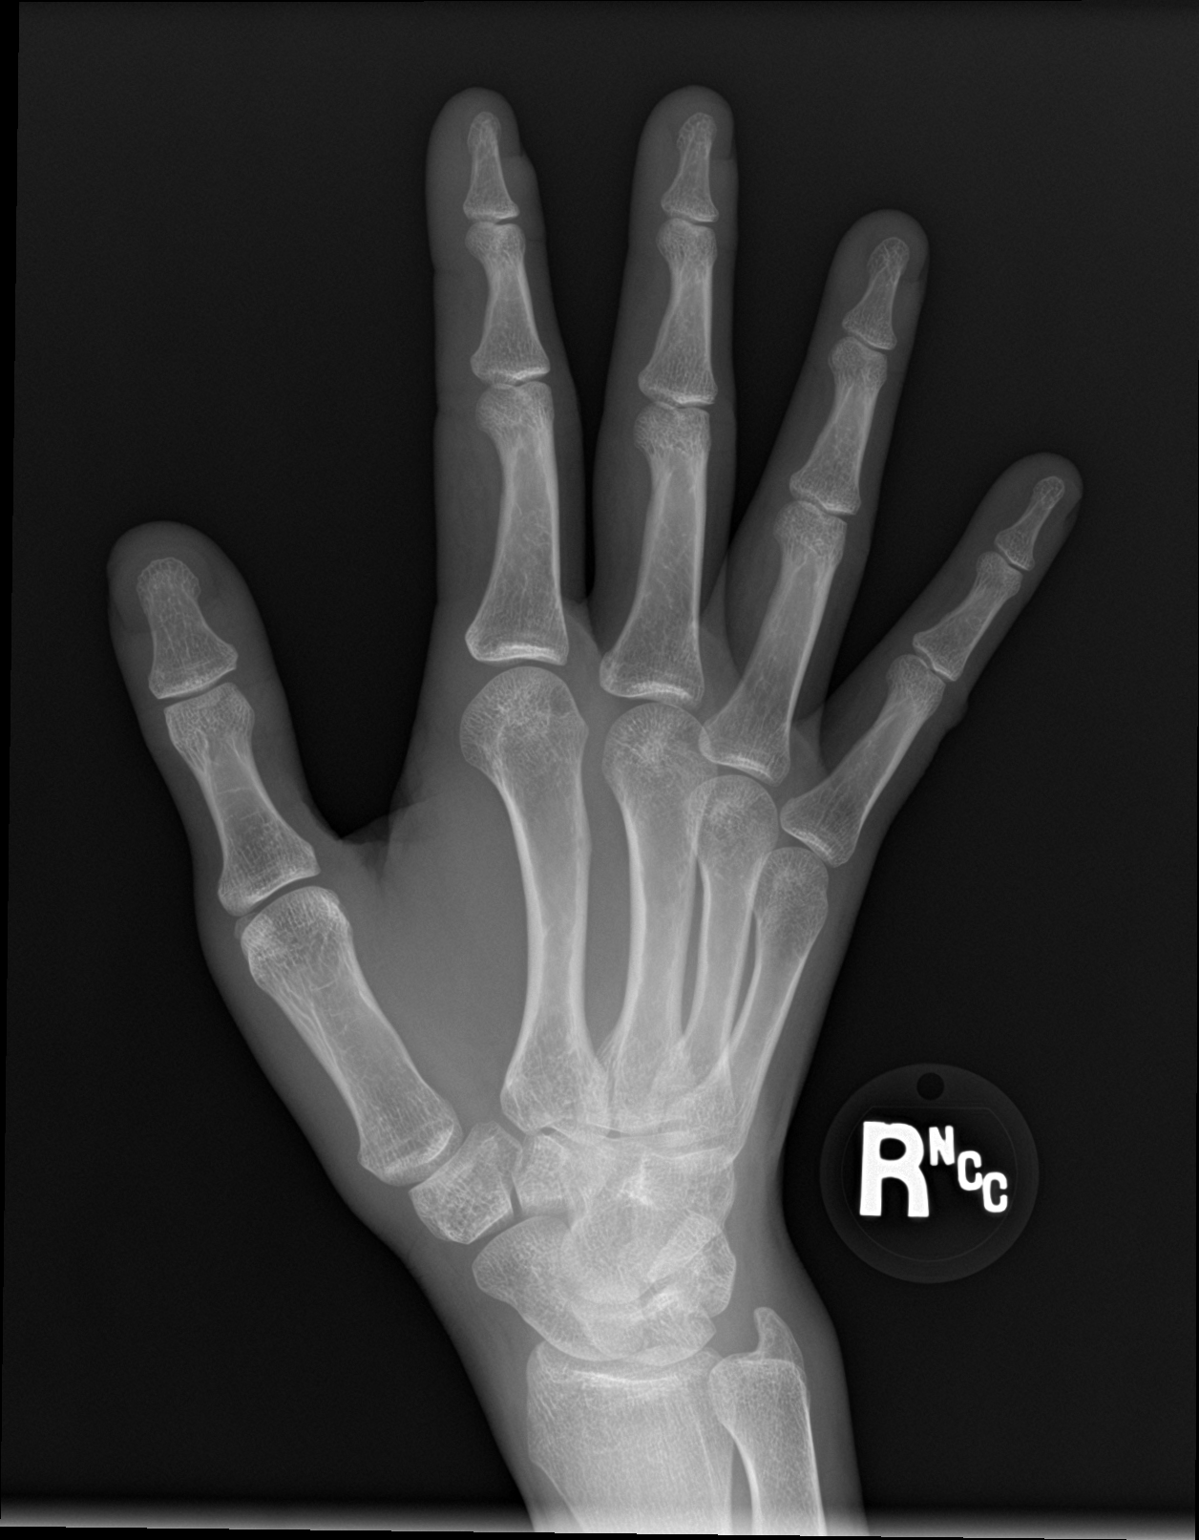

[hand lat]
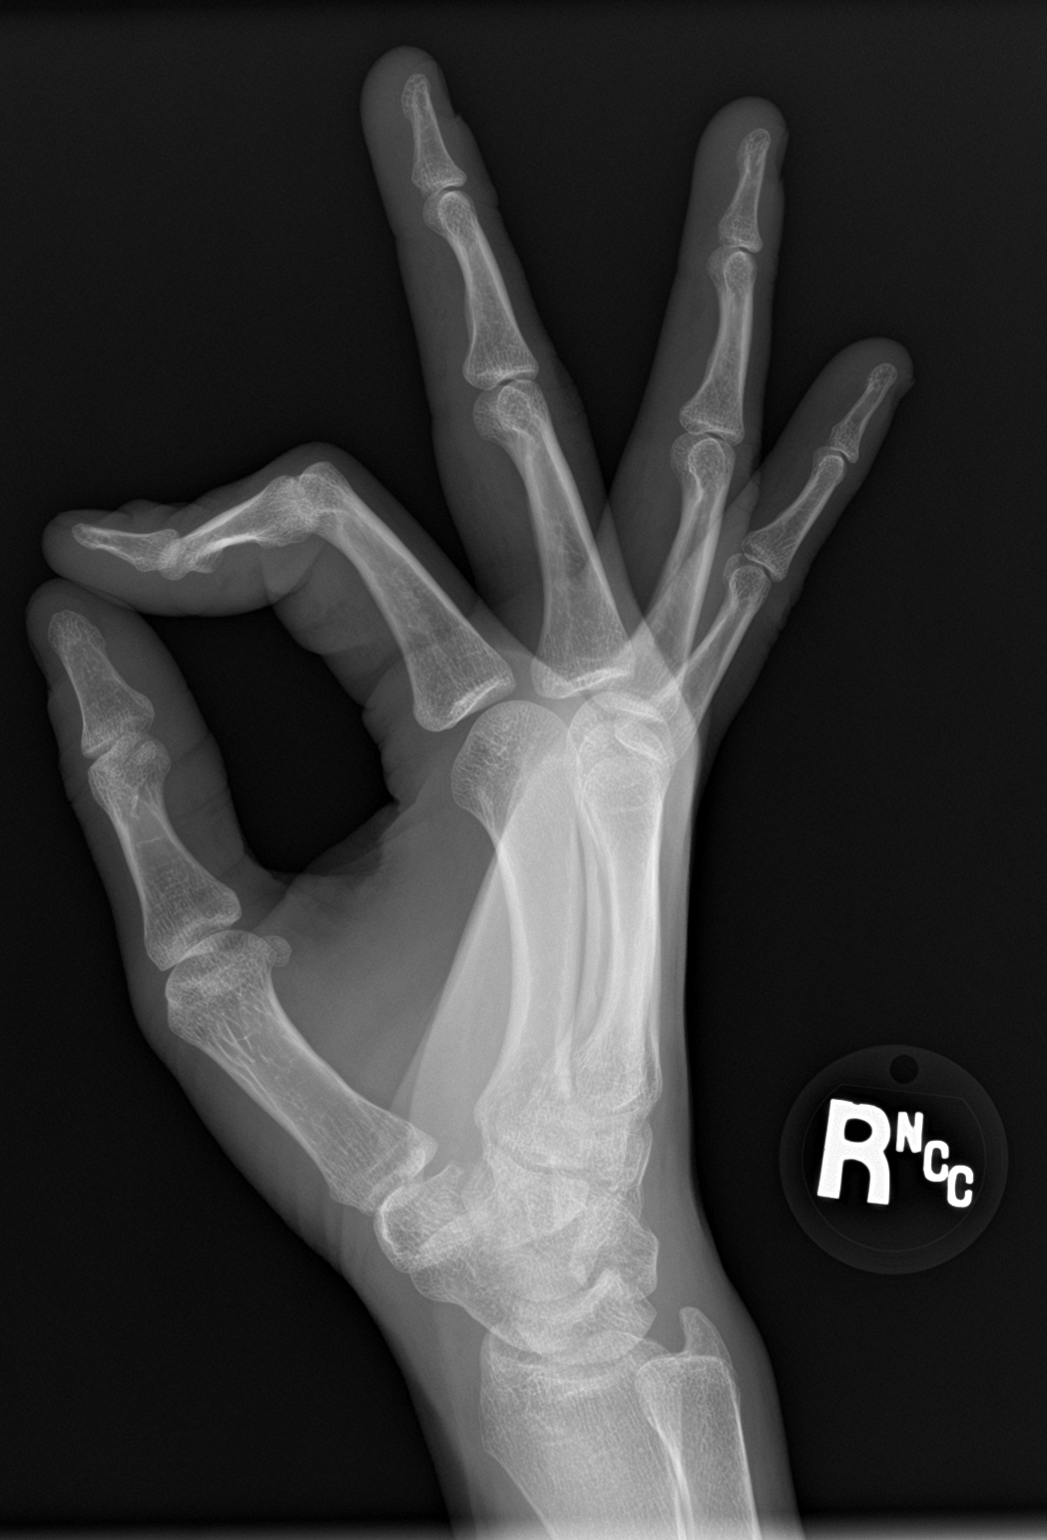

[3 of 3 positions shown; findings below may reference images not displayed]

FINDINGS: No acute fracture or dislocation.  No definite soft tissue swelling.
IMPRESSION: No acute osseous abnormality.

## 2019-11-17 ENCOUNTER — Other Ambulatory Visit: Payer: Self-pay

## 2019-11-17 ENCOUNTER — Emergency Department (HOSPITAL_COMMUNITY)
Admission: EM | Admit: 2019-11-17 | Discharge: 2019-11-18 | Disposition: A | Discharge status: Home / Self Care | Payer: 59 | Attending: Emergency Medicine | Admitting: Emergency Medicine

## 2019-11-17 DIAGNOSIS — R0602 Shortness of breath: Secondary | ICD-10-CM | POA: Diagnosis not present

## 2019-11-17 DIAGNOSIS — R0789 Other chest pain: Secondary | ICD-10-CM | POA: Diagnosis present

## 2019-11-17 DIAGNOSIS — Z87891 Personal history of nicotine dependence: Secondary | ICD-10-CM | POA: Insufficient documentation

## 2019-11-18 ENCOUNTER — Encounter (HOSPITAL_COMMUNITY): Payer: Self-pay | Admitting: Emergency Medicine

## 2019-11-18 ENCOUNTER — Emergency Department (HOSPITAL_COMMUNITY): Payer: 59

## 2019-11-18 ENCOUNTER — Other Ambulatory Visit: Payer: Self-pay

## 2019-11-18 LAB — CBC
HCT: 37.3 % (ref 36.0–46.0)
Hemoglobin: 12.4 g/dL (ref 12.0–15.0)
MCH: 31.6 pg (ref 26.0–34.0)
MCHC: 33.2 g/dL (ref 30.0–36.0)
MCV: 95.2 fL (ref 80.0–100.0)
Platelets: 256 10*3/uL (ref 150–400)
RBC: 3.92 MIL/uL (ref 3.87–5.11)
RDW: 11.8 % (ref 11.5–15.5)
WBC: 7.3 10*3/uL (ref 4.0–10.5)
nRBC: 0 % (ref 0.0–0.2)

## 2019-11-18 LAB — BASIC METABOLIC PANEL
Anion gap: 8 (ref 5–15)
BUN: 10 mg/dL (ref 6–20)
CO2: 25 mmol/L (ref 22–32)
Calcium: 9.2 mg/dL (ref 8.9–10.3)
Chloride: 105 mmol/L (ref 98–111)
Creatinine, Ser: 0.79 mg/dL (ref 0.44–1.00)
GFR calc Af Amer: >60 mL/min (ref >60)
GFR calc non Af Amer: >60 mL/min (ref >60)
Glucose, Bld: 111 mg/dL — ABNORMAL HIGH (ref 70–99)
Potassium: 3.5 mmol/L (ref 3.5–5.1)
Sodium: 138 mmol/L (ref 135–145)

## 2019-11-18 LAB — TROPONIN I (HIGH SENSITIVITY)
Troponin I (High Sensitivity): <2 ng/L (ref <18)
Troponin I (High Sensitivity): <2 ng/L

## 2019-11-18 LAB — I-STAT BETA HCG BLOOD, ED (MC, WL, AP ONLY): I-stat hCG, quantitative: <5 m[IU]/mL

## 2019-11-18 MED ORDER — IBUPROFEN 600 MG PO TABS: 600.0000 mg | ORAL_TABLET | Freq: Four times a day (QID) | ORAL | 0 refills | Status: DC | PRN

## 2019-11-18 MED ORDER — SODIUM CHLORIDE 0.9% FLUSH: 3.0000 mL | Freq: Once | INTRAVENOUS | Status: DC

## 2019-11-18 MED ORDER — IBUPROFEN 400 MG PO TABS
600.0000 mg | ORAL_TABLET | Freq: Once | ORAL | Status: AC
  Administered 2019-11-18: 600 mg via ORAL
  Filled 2019-11-18: qty 1

## 2019-11-18 NOTE — ED Provider Notes (Signed)
[facility] EMERGENCY DEPARTMENT Provider Note   CSN: [id number] Arrival date & time: 11/17/19  2341     History Chief Complaint  Patient presents with  . Chest Pain    Emily Frazier is a 24 y.o. female who presents to the emergency department with a chief complaint of left-sided upper chest pain for the last 3 days.  Pain is intermittent and is worse when she turns her neck to the right and with some positional changes.  She did have 1 episode where the pain radiated down her right arm as sharp pain and radiated to her right upper back, but both of these have resolved.  She reports that she has had some intermittent minimal shortness of breath.  No cough, fever, chills, leg swelling, palpitations, GI, or GU symptoms.  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  No treatment prior to arrival.  No significant past medical history.  No daily medications.  The history is provided by the patient. No language interpreter was used.       Past Medical History:  Diagnosis Date  . Pregnancy induced hypertension     Patient Active Problem List   Diagnosis Date Noted  . Labor and delivery, indication for care 06/14/2018  . History of pre-eclampsia in prior pregnancy, currently pregnant 12/02/2017  . History of prior pregnancy with IUGR newborn 12/02/2017    Past Surgical History:  Procedure Laterality Date  . WISDOM TOOTH EXTRACTION       OB History    Gravida  3   Para  2   Term  2   Preterm  0   AB  1   Living  2     SAB  1   TAB  0   Ectopic  0   Multiple  0   Live Births  2           Family History  Problem Relation Age of Onset  . Cancer Sister 6  . Arthritis Neg Hx   . Diabetes Neg Hx   . Heart disease Neg Hx   . Hypertension Neg Hx   . Stroke Neg Hx     Social History   Tobacco Use  . Smoking status: Former Smoker    Packs/day: 0.10    Quit date: 08/27/2016   Years since quitting: 3.2  . Smokeless tobacco: Never Used  Substance Use Topics  . Alcohol use: No  . Drug use: No    Home Medications Prior to Admission medications   Medication Sig Start Date End Date Taking? Authorizing Provider  etonogestrel (NEXPLANON) 68 MG IMPL implant 1 each (68 mg total) by Subdermal route once for 1 dose. 06/14/18 11/17/28 Yes Gwenevere Abbot, MD  ibuprofen (ADVIL) 600 MG tablet Take 1 tablet (600 mg total) by mouth every 6 (six) hours as needed. 11/18/19   Nylene Inlow A, PA-C  metoCLOPramide (REGLAN) 10 MG tablet Take 1 tablet (10 mg total) by mouth every 6 (six) hours as needed for nausea. Patient not taking: Reported on 11/18/2019 08/14/18 11/18/19  Ward, Layla Maw, DO  potassium chloride SA (K-DUR,KLOR-CON) 20 MEQ tablet Take 1 tablet (20 mEq total) by mouth 2 (two) times daily. Patient not taking: Reported on 11/18/2019 08/14/18 11/18/19  Ward, Layla Maw, DO  promethazine (PHENERGAN) 25 MG tablet Take 1 tablet (25 mg total) by mouth every 6 (six) hours as needed for nausea  or vomiting. Patient not taking: Reported on 11/18/2019 08/11/18 11/18/19  Eyvonne Mechanic, PA-C    Allergies    Patient has no known allergies.  Review of Systems   Review of Systems  Constitutional: Negative for activity change, chills, diaphoresis and fever.  Respiratory: Positive for shortness of breath. Negative for wheezing.   Cardiovascular: Positive for chest pain. Negative for palpitations and leg swelling.  Gastrointestinal: Negative for abdominal pain, diarrhea, nausea and vomiting.  Genitourinary: Negative for dysuria.  Musculoskeletal: Negative for back pain.  Skin: Negative for rash.  Allergic/Immunologic: Negative for immunocompromised state.  Neurological: Negative for seizures, syncope, weakness and headaches.  Psychiatric/Behavioral: Negative for confusion.    Physical Exam Updated Vital Signs BP 108/72 (BP Location: Right Arm)   Pulse 64   Temp 98.1 F (36.7 C)  (Oral)   Resp 16   Ht 5\' 7"  (1.702 m)   Wt 55 kg   LMP 11/12/2019   SpO2 99%   BMI 18.99 kg/m   Physical Exam Vitals and nursing note reviewed.  Constitutional:      General: She is not in acute distress.    Appearance: She is not ill-appearing, toxic-appearing or diaphoretic.  HENT:     Head: Normocephalic.  Eyes:     Conjunctiva/sclera: Conjunctivae normal.  Cardiovascular:     Rate and Rhythm: Normal rate and regular rhythm.     Heart sounds: No murmur. No friction rub. No gallop.   Pulmonary:     Effort: Pulmonary effort is normal. No respiratory distress.     Breath sounds: Normal breath sounds. No stridor. No wheezing, rhonchi or rales.     Comments: Reproducible tenderness palpation to the left upper anterior chest wall.  No crepitus or step-offs.  No overlying rashes, redness, or warmth. Chest:     Chest wall: Tenderness present.  Abdominal:     General: There is no distension.     Palpations: Abdomen is soft. There is no mass.     Tenderness: There is no abdominal tenderness. There is no right CVA tenderness, left CVA tenderness, guarding or rebound.     Hernia: No hernia is present.  Musculoskeletal:     Cervical back: Neck supple.     Right lower leg: No edema.     Left lower leg: No edema.  Skin:    General: Skin is warm.     Findings: No rash.  Neurological:     Mental Status: She is alert.  Psychiatric:        Behavior: Behavior normal.     ED Results / Procedures / Treatments   Labs (all labs ordered are listed, but only abnormal results are displayed) Labs Reviewed  BASIC METABOLIC PANEL - Abnormal; Notable for the following components:      Result Value   Glucose, Bld 111 (*)    All other components within normal limits  CBC  I-STAT BETA HCG BLOOD, ED ([facility], [facility], [facility] ONLY)  TROPONIN I (HIGH SENSITIVITY)  TROPONIN I (HIGH SENSITIVITY)    EKG EKG Interpretation  Date/Time:  Tuesday November 17 2019 23:42:38 EDT Ventricular Rate:  68 PR  Interval:  106 QRS Duration: 76 QT Interval:  392 QTC Calculation: 416 R Axis:   78 Text Interpretation: Sinus rhythm with short PR Nonspecific ST abnormality Abnormal ECG No old tracing to compare Confirmed by [id number] [phone]) on 11/18/2019 7:33:52 AM   Radiology DG Chest 2 View  Result Date: 11/18/2019 CLINICAL DATA:  Chest pain, shortness of  breath EXAM: CHEST - 2 VIEW COMPARISON:  None. FINDINGS: The heart size and mediastinal contours are within normal limits. Both lungs are clear. The visualized skeletal structures are unremarkable. IMPRESSION: Normal study. Electronically Signed   By: Rolm Baptise M.D.   On: 11/18/2019 01:48    Procedures Procedures (including critical care time)  Medications Ordered in ED Medications  sodium chloride flush (NS) 0.9 % injection 3 mL (has no administration in time range)  ibuprofen (ADVIL) tablet 600 mg (600 mg Oral Given 11/18/19 0631)    ED Course  I have reviewed the triage vital signs and the nursing notes.  Pertinent labs & imaging results that were available during my care of the patient were reviewed by me and considered in my medical decision making (see chart for details).    MDM Rules/Calculators/A&P                      24 year old otherwise healthy female presenting with chest pain that is worse with movements and rotating her neck to the right for the last 3 days.  No treatment prior to arrival.  She has reproducible tenderness to palpation on exam.  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  EKG with normal sinus rhythm.  Troponin is normal.  Chest x-ray is unremarkable.  Labs are otherwise reassuring.  Pregnancy test is negative.  Doubt ACS, aortic dissection, PE, tension pneumothorax.  Strongly suspect musculoskeletal etiology given that pain is reproducible on exam.  Will give ibuprofen in the ER and recommended over-the-counter pain medication.  All questions answered.  Return precautions  given.  She is hemodynamically stable and in no acute distress.  Safe for discharge home with outpatient follow-up as indicated.  Final Clinical Impression(s) / ED Diagnoses Final diagnoses:  Chest wall pain    Rx / DC Orders ED Discharge Orders         Ordered    ibuprofen (ADVIL) 600 MG tablet  Every 6 hours PRN     11/18/19 0616           Joanne Gavel, PA-C 11/18/19 7412    Merryl Hacker, MD 11/19/19 [phone]

## 2019-11-18 NOTE — ED Triage Notes (Signed)
Patient reports left chest pain  with mild SOB onset 3 days ago , no emesis or diaphoresis , denies cough or fever .

## 2019-11-18 NOTE — Discharge Instructions (Addendum)
Thank you for allowing me to care for you today in the Emergency Department.   You were seen in the emergency department for chest pain.  Your work-up was reassuring.  Your symptoms are concerning for chest wall pain.  Take 650 mg of Tylenol or 600 mg of ibuprofen with food every 6 hours for pain.  You can alternate between these 2 medications every 3 hours if your pain returns.  For instance, you can take Tylenol at noon, followed by a dose of ibuprofen at 3, followed by second dose of Tylenol and 6.  You can try applying over-the-counter muscle pain relief creams to the area.  Try to stretch the muscles of your chest and upper back as your pain allows.  You can call the number on your discharge paperwork to get established with a primary care provider if symptoms persist.  Return the emergency department if you develop respiratory distress, if you pass out, if you develop a rash over the area, fevers and chills with worsening pain, or other new, concerning symptoms.

## 2020-10-31 ENCOUNTER — Ambulatory Visit: Payer: Self-pay | Admitting: Internal Medicine

## 2020-10-31 DIAGNOSIS — Z0289 Encounter for other administrative examinations: Secondary | ICD-10-CM

## 2020-11-09 ENCOUNTER — Ambulatory Visit: Payer: 59 | Admitting: Family

## 2022-05-03 DIAGNOSIS — Z7251 High risk heterosexual behavior: Secondary | ICD-10-CM | POA: Diagnosis not present

## 2022-05-03 DIAGNOSIS — R69 Illness, unspecified: Secondary | ICD-10-CM | POA: Diagnosis not present

## 2022-05-03 DIAGNOSIS — Z124 Encounter for screening for malignant neoplasm of cervix: Secondary | ICD-10-CM | POA: Diagnosis not present

## 2022-05-16 DIAGNOSIS — Z3202 Encounter for pregnancy test, result negative: Secondary | ICD-10-CM | POA: Diagnosis not present

## 2022-05-16 DIAGNOSIS — Z3049 Encounter for surveillance of other contraceptives: Secondary | ICD-10-CM | POA: Diagnosis not present

## 2022-09-15 ENCOUNTER — Other Ambulatory Visit: Payer: Self-pay

## 2022-09-15 ENCOUNTER — Emergency Department (HOSPITAL_BASED_OUTPATIENT_CLINIC_OR_DEPARTMENT_OTHER)
Admission: EM | Admit: 2022-09-15 | Discharge: 2022-09-15 | Disposition: A | Discharge status: Home / Self Care | Payer: 59 | Attending: Emergency Medicine | Admitting: Emergency Medicine

## 2022-09-15 ENCOUNTER — Encounter (HOSPITAL_BASED_OUTPATIENT_CLINIC_OR_DEPARTMENT_OTHER): Payer: Self-pay | Admitting: *Deleted

## 2022-09-15 DIAGNOSIS — J101 Influenza due to other identified influenza virus with other respiratory manifestations: Secondary | ICD-10-CM | POA: Diagnosis not present

## 2022-09-15 DIAGNOSIS — Z20822 Contact with and (suspected) exposure to covid-19: Secondary | ICD-10-CM | POA: Diagnosis not present

## 2022-09-15 DIAGNOSIS — R059 Cough, unspecified: Secondary | ICD-10-CM | POA: Diagnosis not present

## 2022-09-15 DIAGNOSIS — J111 Influenza due to unidentified influenza virus with other respiratory manifestations: Secondary | ICD-10-CM

## 2022-09-15 LAB — RESP PANEL BY RT-PCR (RSV, FLU A&B, COVID)  RVPGX2
Influenza A by PCR: NEGATIVE
Influenza B by PCR: POSITIVE — AB
Resp Syncytial Virus by PCR: NEGATIVE
SARS Coronavirus 2 by RT PCR: NEGATIVE

## 2022-09-15 MED ORDER — OSELTAMIVIR PHOSPHATE 75 MG PO CAPS: 75.0000 mg | ORAL_CAPSULE | Freq: Two times a day (BID) | ORAL | 0 refills | Status: DC

## 2022-09-15 NOTE — ED Provider Notes (Signed)
[facility] Provider Note   CSN: [id number] Arrival date & time: 09/15/22  1513     History  Chief Complaint  Patient presents with   Nasal Congestion    Emily Frazier is a 27 y.o. female.  She is here for COVID-like symptoms.  She has had 3 days of nasal congestion cough minimally productive 1 episode of vomiting and yesterday she lost her sense of taste and smell.  She did 2 home tests that were negative for COVID.  She wants a test here.  She denies any chance of pregnancy no abdominal pain  The history is provided by the patient.  URI Presenting symptoms: congestion, cough, fatigue and rhinorrhea   Severity:  Moderate Onset quality:  Gradual Duration:  3 days Timing:  Intermittent Progression:  Unchanged Chronicity:  New Relieved by:  None tried Worsened by:  Nothing Ineffective treatments:  None tried Risk factors: no immunosuppression        Home Medications Prior to Admission medications   Medication Sig Start Date End Date Taking? Authorizing Provider  etonogestrel (NEXPLANON) 68 MG IMPL implant 1 each (68 mg total) by Subdermal route once for 1 dose. 06/14/18 11/17/28  Aura Camps, MD  ibuprofen (ADVIL) 600 MG tablet Take 1 tablet (600 mg total) by mouth every 6 (six) hours as needed. 11/18/19   McDonald, Mia A, PA-C  metoCLOPramide (REGLAN) 10 MG tablet Take 1 tablet (10 mg total) by mouth every 6 (six) hours as needed for nausea. Patient not taking: Reported on 11/18/2019 08/14/18 11/18/19  Ward, Delice Bison, DO  potassium chloride SA (K-DUR,KLOR-CON) 20 MEQ tablet Take 1 tablet (20 mEq total) by mouth 2 (two) times daily. Patient not taking: Reported on 11/18/2019 08/14/18 11/18/19  Ward, Delice Bison, DO  promethazine (PHENERGAN) 25 MG tablet Take 1 tablet (25 mg total) by mouth every 6 (six) hours as needed for nausea or vomiting. Patient not taking: Reported on 11/18/2019 08/11/18 11/18/19  Okey Regal, PA-C       Allergies    Patient has no known allergies.    Review of Systems   Review of Systems  Constitutional:  Positive for fatigue.  HENT:  Positive for congestion and rhinorrhea.   Respiratory:  Positive for cough.   Cardiovascular:  Negative for chest pain.  Gastrointestinal:  Positive for nausea and vomiting.  Genitourinary:  Negative for dysuria.    Physical Exam Updated Vital Signs BP 120/84 (BP Location: Right Arm)   Pulse 77   Temp 98.6 F (37 C) (Oral)   Resp 18   Ht 5\' 6"  (1.676 m)   Wt 53.5 kg   LMP 09/13/2022 (Exact Date)   SpO2 99%   BMI 19.05 kg/m  Physical Exam Vitals and nursing note reviewed.  Constitutional:      General: She is not in acute distress.    Appearance: Normal appearance. She is well-developed.  HENT:     Head: Normocephalic and atraumatic.  Eyes:     Conjunctiva/sclera: Conjunctivae normal.  Cardiovascular:     Rate and Rhythm: Normal rate and regular rhythm.     Heart sounds: No murmur heard. Pulmonary:     Effort: Pulmonary effort is normal. No respiratory distress.     Breath sounds: Normal breath sounds.  Abdominal:     Palpations: Abdomen is soft.     Tenderness: There is no abdominal tenderness. There is no guarding or rebound.  Musculoskeletal:     Cervical back: Neck  supple.  Skin:    General: Skin is warm and dry.     Capillary Refill: Capillary refill takes less than 2 seconds.  Neurological:     Mental Status: She is alert.     ED Results / Procedures / Treatments   Labs (all labs ordered are listed, but only abnormal results are displayed) Labs Reviewed  RESP PANEL BY RT-PCR (RSV, FLU A&B, COVID)  RVPGX2 - Abnormal; Notable for the following components:      Result Value   Influenza B by PCR POSITIVE (*)    All other components within normal limits    EKG None  Radiology No results found.  Procedures Procedures    Medications Ordered in ED Medications - No data to display  ED Course/ Medical Decision  Making/ A&P                             Medical Decision Making Risk Prescription drug management.   SEVANA GRANDINETTI was evaluated in Emergency Department on 09/15/2022 for the symptoms described in the history of present illness. She was evaluated in the context of the global COVID-19 pandemic, which necessitated consideration that the patient might be at risk for infection with the SARS-CoV-2 virus that causes COVID-19. Institutional protocols and algorithms that pertain to the evaluation of patients at risk for COVID-19 are in a state of rapid change based on information released by regulatory bodies including the CDC and federal and state organizations. These policies and algorithms were followed during the patient's care in the ED. This patient complains of URI symptoms; this involves an extensive number of treatment Options and is a complaint that carries with it a high risk of complications and morbidity. The differential includes COVID, flu, RSV, viral  I ordered, reviewed and interpreted labs, which included flu positive Previous records obtained and reviewed in epic no recent admissions Social determinants considered, no significant barriers Critical Interventions: None  After the interventions stated above, I reevaluated the patient and found patient to be resting comfortably no distress stable vitals Admission and further testing considered, discussed symptomatic treatment and given prescription for Tamiflu.  Return instructions discussed.         Final Clinical Impression(s) / ED Diagnoses Final diagnoses:  Flu    Rx / DC Orders ED Discharge Orders          Ordered    oseltamivir (TAMIFLU) 75 MG capsule  Every 12 hours        09/15/22 1612              Hayden Rasmussen, MD 09/16/22 [phone]

## 2022-09-15 NOTE — ED Triage Notes (Signed)
Here for nasal congestion, drainage, chills, and loss of taste and smell. Wants covid test. Alert, NAD, calm, interactive. Sx onset Thursday.

## 2023-05-10 DIAGNOSIS — Z124 Encounter for screening for malignant neoplasm of cervix: Secondary | ICD-10-CM | POA: Diagnosis not present

## 2023-05-10 DIAGNOSIS — Z113 Encounter for screening for infections with a predominantly sexual mode of transmission: Secondary | ICD-10-CM | POA: Diagnosis not present

## 2023-05-10 DIAGNOSIS — Z01419 Encounter for gynecological examination (general) (routine) without abnormal findings: Secondary | ICD-10-CM | POA: Diagnosis not present

## 2023-05-10 DIAGNOSIS — Z30432 Encounter for removal of intrauterine contraceptive device: Secondary | ICD-10-CM | POA: Diagnosis not present

## 2023-06-24 ENCOUNTER — Emergency Department (HOSPITAL_BASED_OUTPATIENT_CLINIC_OR_DEPARTMENT_OTHER): Payer: 59

## 2023-06-24 ENCOUNTER — Encounter (HOSPITAL_BASED_OUTPATIENT_CLINIC_OR_DEPARTMENT_OTHER): Payer: Self-pay

## 2023-06-24 ENCOUNTER — Emergency Department (HOSPITAL_BASED_OUTPATIENT_CLINIC_OR_DEPARTMENT_OTHER)
Admission: EM | Admit: 2023-06-24 | Discharge: 2023-06-24 | Disposition: A | Discharge status: Home / Self Care | Payer: 59 | Attending: Emergency Medicine | Admitting: Emergency Medicine

## 2023-06-24 ENCOUNTER — Other Ambulatory Visit: Payer: Self-pay

## 2023-06-24 DIAGNOSIS — S62001A Unspecified fracture of navicular [scaphoid] bone of right wrist, initial encounter for closed fracture: Secondary | ICD-10-CM | POA: Diagnosis not present

## 2023-06-24 DIAGNOSIS — S6991XA Unspecified injury of right wrist, hand and finger(s), initial encounter: Secondary | ICD-10-CM | POA: Diagnosis not present

## 2023-06-24 DIAGNOSIS — S62014A Nondisplaced fracture of distal pole of navicular [scaphoid] bone of right wrist, initial encounter for closed fracture: Secondary | ICD-10-CM | POA: Insufficient documentation

## 2023-06-24 DIAGNOSIS — Y9321 Activity, ice skating: Secondary | ICD-10-CM | POA: Diagnosis not present

## 2023-06-24 DIAGNOSIS — M25531 Pain in right wrist: Secondary | ICD-10-CM | POA: Insufficient documentation

## 2023-06-24 DIAGNOSIS — Z043 Encounter for examination and observation following other accident: Secondary | ICD-10-CM | POA: Diagnosis not present

## 2023-06-24 MED ORDER — OXYCODONE HCL 5 MG PO TABS: 5.0000 mg | ORAL_TABLET | ORAL | 0 refills | Status: DC | PRN

## 2023-06-24 NOTE — ED Triage Notes (Signed)
In for eval of right wrist pain. Fell while skating last night.

## 2023-06-24 NOTE — ED Provider Notes (Signed)
[place] EMERGENCY DEPARTMENT AT [facility] Provider Note   CSN: [id number] Arrival date & time: 06/24/23  3086     History  Chief Complaint  Patient presents with   Wrist Pain    Emily Frazier is a 27 y.o. female.  27 year old female right-handed who presents emergency department with right wrist pain.  Patient reports that last night she was ice-skating.  Emily Frazier backwards and landed on an outstretched wrist.  Afterwards started having swelling of her wrist.  This morning the pain got worse so she decided to come into the emergency department for evaluation.  Did attempt elevating and icing the wrist.  No history of surgeries on that side.  Works from home and has to type for work.  Says that pain limits the range of motion of her fingers.       Home Medications Prior to Admission medications   Medication Sig Start Date End Date Taking? Authorizing Provider  oxyCODONE (ROXICODONE) 5 MG immediate release tablet Take 1 tablet (5 mg total) by mouth every 4 (four) hours as needed. 06/24/23  Yes Rondel Baton, MD  etonogestrel (NEXPLANON) 68 MG IMPL implant 1 each (68 mg total) by Subdermal route once for 1 dose. 06/14/18 11/17/28  Gwenevere Abbot, MD  ibuprofen (ADVIL) 600 MG tablet Take 1 tablet (600 mg total) by mouth every 6 (six) hours as needed. 11/18/19   McDonald, Mia A, PA-C  oseltamivir (TAMIFLU) 75 MG capsule Take 1 capsule (75 mg total) by mouth every 12 (twelve) hours. 09/15/22   Terrilee Files, MD  metoCLOPramide (REGLAN) 10 MG tablet Take 1 tablet (10 mg total) by mouth every 6 (six) hours as needed for nausea. Patient not taking: Reported on 11/18/2019 08/14/18 11/18/19  Ward, Layla Maw, DO  potassium chloride SA (K-DUR,KLOR-CON) 20 MEQ tablet Take 1 tablet (20 mEq total) by mouth 2 (two) times daily. Patient not taking: Reported on 11/18/2019 08/14/18 11/18/19  Ward, Layla Maw, DO  promethazine (PHENERGAN) 25 MG tablet Take 1 tablet (25 mg total) by mouth  every 6 (six) hours as needed for nausea or vomiting. Patient not taking: Reported on 11/18/2019 08/11/18 11/18/19  Eyvonne Mechanic, PA-C      Allergies    Patient has no known allergies.    Review of Systems   Review of Systems  Physical Exam Updated Vital Signs BP 112/67   Pulse 64   Temp 97.8 F (36.6 C) (Oral)   Resp 16   Ht 5\' 7"  (1.702 m)   Wt 53.5 kg   LMP 05/17/2023   SpO2 99%   BMI 18.48 kg/m  Physical Exam Musculoskeletal:     Comments: Snuffbox tenderness to palpation.  Tenderness to palpation of the distal radius.  No ulnar tenderness to palpation.  Also does have tenderness to palpation along the radial aspect of the extensor tendons.  Significant swelling of the medial dorsum of the right wrist.  Intact sensation to light touch of all fingers and palm and dorsal aspect of the hand.  Radial pulse 2+.  Able to move all fingers though pain does limit flexion and extension of her thumb and right index finger.     ED Results / Procedures / Treatments   Labs (all labs ordered are listed, but only abnormal results are displayed) Labs Reviewed - No data to display  EKG None  Radiology DG Wrist Complete Right  Result Date: 06/24/2023 CLINICAL DATA:  Fall on outstretched hand. EXAM: RIGHT WRIST - COMPLETE 3+  VIEW COMPARISON:  02/05/2017. FINDINGS: There is subtle incomplete breech in the cortex of the distal aspect of the scaphoid without significant displacement. This is concerning for subtle acute undisplaced fracture. Correlate clinically. Consider follow-up exam in 10-14 days to look for sclerosis/callus formation for confirmation. Otherwise, no acute fracture or dislocation. No aggressive osseous lesion. No significant arthritis of the wrist joint. No radiopaque foreign bodies. Soft tissues are within normal limits. IMPRESSION: *Findings concerning for subtle incomplete undisplaced fracture of the distal pole of scaphoid, as described above along with follow-up  recommendations. Electronically Signed   By: Jules Schick M.D.   On: 06/24/2023 09:08    Procedures Procedures    Medications Ordered in ED Medications - No data to display  ED Course/ Medical Decision Making/ A&P                                 Medical Decision Making Amount and/or Complexity of Data Reviewed Radiology: ordered.  Risk Prescription drug management.   Emily Frazier is a 27 y.o. female who is right-handed who presents to the emergency department with right wrist pain after a fall on an outstretched hand  Initial Ddx:  Scaphoid fracture, distal radius fracture, ligamentous injury  MDM/Course:  Patient presents to the emergency department with pain and swelling after falling on her hand while icing.  No other injuries from the fall.  Does have swelling and scaphoid tenderness to palpation.  X-rays were obtained which did show possible nondisplaced scaphoid fracture.  Was placed in a thumb spica splint.  Will have her follow-up with hand surgery for further evaluation in a week.  This patient presents to the ED for concern of complaints listed in HPI, this involves an extensive number of treatment options, and is a complaint that carries with it a high risk of complications and morbidity. Disposition including potential need for admission considered.   Dispo: DC Home. Return precautions discussed including, but not limited to, those listed in the AVS. Allowed pt time to ask questions which were answered fully prior to dc.  Records reviewed Outpatient Clinic Notes I independently reviewed the following imaging with scope of interpretation limited to determining acute life threatening conditions related to emergency care: Extremity x-ray(s) and agree with the radiologist interpretation with the following exceptions: none I have reviewed the patients home medications and made adjustments as needed  Portions of this note were generated with Dragon dictation  software. Dictation errors may occur despite best attempts at proofreading.           Final Clinical Impression(s) / ED Diagnoses Final diagnoses:  Closed nondisplaced fracture of distal pole of scaphoid bone of right wrist, initial encounter    Rx / DC Orders ED Discharge Orders          Ordered    oxyCODONE (ROXICODONE) 5 MG immediate release tablet  Every 4 hours PRN        06/24/23 0915              Rondel Baton, MD 06/24/23 [phone]

## 2023-06-24 NOTE — ED Notes (Signed)
Thumb spica placed.

## 2023-06-24 NOTE — Discharge Instructions (Addendum)
You were seen for your broken wrist (scaphoid) in the emergency department.    At home, please keep the splint on until you are able to see the orthopedic surgeons.     Take Tylenol and ibuprofen for the pain.  You may also take the oxycodone we have prescribed you for any breakthrough pain that may have.  Do not take this before driving or operating heavy machinery.  Do not take this medication with alcohol.    Follow-up with the orthopedic surgeons within the next week.    Return immediately to the emergency department if you experience any of the following: Severe pain, numbness or weakness of your extremity, or any other concerning symptoms.     Thank you for visiting our Emergency Department. It was a pleasure taking care of you today.

## 2023-07-02 ENCOUNTER — Encounter: Payer: Self-pay | Admitting: Physician Assistant

## 2023-07-02 ENCOUNTER — Other Ambulatory Visit (INDEPENDENT_AMBULATORY_CARE_PROVIDER_SITE_OTHER): Payer: Self-pay

## 2023-07-02 ENCOUNTER — Ambulatory Visit (INDEPENDENT_AMBULATORY_CARE_PROVIDER_SITE_OTHER): Payer: 59 | Admitting: Physician Assistant

## 2023-07-02 DIAGNOSIS — S62101A Fracture of unspecified carpal bone, right wrist, initial encounter for closed fracture: Secondary | ICD-10-CM | POA: Diagnosis not present

## 2023-07-02 DIAGNOSIS — S62001A Unspecified fracture of navicular [scaphoid] bone of right wrist, initial encounter for closed fracture: Secondary | ICD-10-CM | POA: Diagnosis not present

## 2023-07-02 DIAGNOSIS — S62009A Unspecified fracture of navicular [scaphoid] bone of unspecified wrist, initial encounter for closed fracture: Secondary | ICD-10-CM | POA: Insufficient documentation

## 2023-07-02 NOTE — Progress Notes (Signed)
Office Visit Note   Patient: Emily Frazier           Date of Birth: [birth year]           MRN: [id number] Visit Date: 07/02/2023              Requested by: No referring provider defined for this encounter. PCP: Patient, No Pcp Per   Assessment & Plan: Visit Diagnoses:  1. Closed fracture of right wrist, initial encounter   2. Closed nondisplaced fracture of scaphoid of right wrist, unspecified portion of scaphoid, initial encounter     Plan: Pleasant right-hand-dominant 27 year old woman who is now 9 days status post falling out onto her outstretched hand while skating.  She was seen and evaluated in the emergency room which demonstrated a nondisplaced scaphoid fracture.  She has moderate tenderness over the scaphoid no other wrist tenderness she is neurovascularly intact.  X-rays were reviewed by Dr. Fara Boros.  She will go into a thumb spica cast today follow-up in 2 weeks for repeat x-rays  Follow-Up Instructions: 2 weeks  Orders:  Orders Placed This Encounter  Procedures   XR Wrist Complete Right   No orders of the defined types were placed in this encounter.     Procedures: No procedures performed   Clinical Data: No additional findings.   Subjective: No chief complaint on file.   HPI pleasant 27 year old woman with a 9-day history of right wrist pain.  She is right-hand dominant and sustained a fall onto her wrist 9 days ago.  She was seen evaluated in the emergency room was diagnosed with a nondisplaced scaphoid fracture was placed in a thumb spica splint.  Rates her pain is moderate.  She works at home.  She is right-hand dominant  Review of Systems  All other systems reviewed and are negative.    Objective: Vital Signs: LMP 05/17/2023   Physical Exam Constitutional:      Appearance: Normal appearance.  Pulmonary:     Effort: Pulmonary effort is normal.  Skin:    General: Skin is warm and dry.  Neurological:     General: No focal deficit present.      Mental Status: She is alert and oriented to person, place, and time.  Psychiatric:        Mood and Affect: Mood normal.        Behavior: Behavior normal.    Ortho Exam Examination of her right wrist she has a palpable pulse she is neurovascularly intact compartments are soft nontender skin is intact she has brisk capillary refill she is tender over the anatomic snuffbox Specialty Comments:  No specialty comments available.  Imaging: XR Wrist Complete Right  Result Date: 07/02/2023 Complete the graphs of her right wrist were obtained today show nondisplaced fracture of the scaphoid in good position    PMFS History: Patient Active Problem List   Diagnosis Date Noted   Scaphoid fracture, wrist, closed 07/02/2023   Labor and delivery, indication for care 06/14/2018   History of pre-eclampsia in prior pregnancy, currently pregnant 12/02/2017   History of prior pregnancy with IUGR newborn 12/02/2017   Past Medical History:  Diagnosis Date   Pregnancy induced hypertension     Family History  Problem Relation Age of Onset   Cancer Sister 6   Arthritis Neg Hx    Diabetes Neg Hx    Heart disease Neg Hx    Hypertension Neg Hx    Stroke Neg Hx     Past  Surgical History:  Procedure Laterality Date   WISDOM TOOTH EXTRACTION     Social History   Occupational History   Not on file  Tobacco Use   Smoking status: Former    Current packs/day: 0.00    Types: Cigarettes    Quit date: 08/27/2016    Years since quitting: 6.8   Smokeless tobacco: Never  Vaping Use   Vaping status: Never Used  Substance and Sexual Activity   Alcohol use: No   Drug use: No   Sexual activity: Yes    Birth control/protection: Implant

## 2023-08-01 ENCOUNTER — Other Ambulatory Visit (INDEPENDENT_AMBULATORY_CARE_PROVIDER_SITE_OTHER): Payer: 59

## 2023-08-01 ENCOUNTER — Ambulatory Visit: Payer: 59 | Admitting: Physician Assistant

## 2023-08-01 DIAGNOSIS — S62001A Unspecified fracture of navicular [scaphoid] bone of right wrist, initial encounter for closed fracture: Secondary | ICD-10-CM

## 2023-08-01 NOTE — Progress Notes (Signed)
Office Visit Note   Patient: Emily Frazier           Date of Birth: February 01, 1996           MRN: [id number] Visit Date: 08/01/2023              Requested by: No referring provider defined for this encounter. PCP: Patient, No Pcp Per  Chief Complaint  Patient presents with   Right Wrist - Follow-up      HPI: Patient is a pleasant 27 year old woman who is now 6 weeks status post nondisplaced scaphoid fracture to her right wrist.  She has been in a thumb spica cast has no complaints  Assessment & Plan: Visit Diagnoses:  1. Closed nondisplaced fracture of scaphoid of right wrist, unspecified portion of scaphoid, initial encounter     Plan: She is doing well reviewed x-rays with Dr. Roda Shutters she will begin hand therapy and go back into her removable splint follow-up for final visit in 4 weeks  Follow-Up Instructions: No follow-ups on file.   Ortho Exam  Patient is alert, oriented, no adenopathy, well-dressed, normal affect, normal respiratory effort. Examination of her right wrist she has no redness no erythema sensation is intact fingers are warm with brisk capillary refill of less than 2 seconds.  No tenderness to palpation over the scaphoid  Imaging: No results found. No images are attached to the encounter.  Labs: Lab Results  Component Value Date   LABORGA NO GROUP B STREP (S.AGALACTIAE) ISOLATED 10/19/2014     Lab Results  Component Value Date   ALBUMIN 4.8 08/30/2018   ALBUMIN 4.1 08/14/2018   ALBUMIN 4.5 08/11/2018    No results found for: "MG" No results found for: "VD25OH"  No results found for: "PREALBUMIN"    Latest Ref Rng & Units 11/18/2019   12:08 AM 08/30/2018    5:24 AM 08/14/2018    3:25 AM  CBC EXTENDED  WBC 4.0 - 10.5 K/uL 7.3  12.0  7.9   RBC 3.87 - 5.11 MIL/uL 3.92  4.14  3.93   Hemoglobin 12.0 - 15.0 g/dL 78.2  95.6  21.3   HCT 36.0 - 46.0 % 37.3  37.8  35.8   Platelets 150 - 400 K/uL 256  281  215   NEUT# 1.7 - 7.7 K/uL   4.9   Lymph#  0.7 - 4.0 K/uL   1.9      There is no height or weight on file to calculate BMI.  Orders:  Orders Placed This Encounter  Procedures   XR Wrist Complete Right   No orders of the defined types were placed in this encounter.    Procedures: No procedures performed  Clinical Data: No additional findings.  ROS:  All other systems negative, except as noted in the HPI. Review of Systems  Objective: Vital Signs: There were no vitals taken for this visit.  Specialty Comments:  No specialty comments available.  PMFS History: Patient Active Problem List   Diagnosis Date Noted   Scaphoid fracture, wrist, closed 07/02/2023   Labor and delivery, indication for care 06/14/2018   History of pre-eclampsia in prior pregnancy, currently pregnant 12/02/2017   History of prior pregnancy with IUGR newborn 12/02/2017   Past Medical History:  Diagnosis Date   Pregnancy induced hypertension     Family History  Problem Relation Age of Onset   Cancer Sister 6   Arthritis Neg Hx    Diabetes Neg Hx    Heart disease  Neg Hx    Hypertension Neg Hx    Stroke Neg Hx     Past Surgical History:  Procedure Laterality Date   WISDOM TOOTH EXTRACTION     Social History   Occupational History   Not on file  Tobacco Use   Smoking status: Former    Current packs/day: 0.00    Types: Cigarettes    Quit date: 08/27/2016    Years since quitting: 6.9   Smokeless tobacco: Never  Vaping Use   Vaping status: Never Used  Substance and Sexual Activity   Alcohol use: No   Drug use: No   Sexual activity: Yes    Birth control/protection: Implant

## 2023-08-02 NOTE — Therapy (Signed)
OUTPATIENT OCCUPATIONAL THERAPY ORTHO EVALUATION  Patient Name: Emily Frazier MRN: [id number] DOB:1995/10/25, 27 y.o., female Today's Date: 08/05/2023  PCP: NA REFERRING PROVIDER: Persons, [place], [place]   END OF SESSION:  OT End of Session - 08/05/23 1108     Visit Number 1    Number of Visits 8    Date for OT Re-Evaluation 09/20/23    Authorization Type [employer]    OT Start Time 1108    OT Stop Time 1149    OT Time Calculation (min) 41 min    Activity Tolerance Patient tolerated treatment well;No increased pain;Patient limited by fatigue;Patient limited by pain    Behavior During Therapy [facility] for tasks assessed/performed             Past Medical History:  Diagnosis Date   Pregnancy induced hypertension    Past Surgical History:  Procedure Laterality Date   WISDOM TOOTH EXTRACTION     Patient Active Problem List   Diagnosis Date Noted   Scaphoid fracture, wrist, closed 07/02/2023   Labor and delivery, indication for care 06/14/2018   History of pre-eclampsia in prior pregnancy, currently pregnant 12/02/2017   History of prior pregnancy with IUGR newborn 12/02/2017    ONSET DATE: DOI: 06/23/23  REFERRING DIAG: S62.001A (ICD-10-CM) - Closed nondisplaced fracture of scaphoid of right wrist, unspecified portion of scaphoid, initial encounter   THERAPY DIAG:  Stiffness of right wrist, not elsewhere classified - Plan: Ot plan of care cert/re-cert  Localized edema - Plan: Ot plan of care cert/re-cert  Muscle weakness (generalized) - Plan: Ot plan of care cert/re-cert  Pain in right wrist - Plan: Ot plan of care cert/re-cert  Other lack of coordination - Plan: Ot plan of care cert/re-cert  Rationale for Evaluation and Treatment: Rehabilitation  SUBJECTIVE:   SUBJECTIVE STATEMENT: She is 6 weeks post fall and nondisplaced scaphoid fracture of the right wrist now.  She states not having any pain at rest but more like feeling stiffness.  When taking off her brace  and moving in certain ways she does get a sharp pain through the dorsum of her scaphoid area by the thumb.  She works from home and she cares for her daughter and these things have been difficult since breaking her wrist..    PERTINENT HISTORY: Right hand therapy, hx rt scaphoid fx   PRECAUTIONS: None  RED FLAGS: None   WEIGHT BEARING RESTRICTIONS: Yes NWB Rt wrist now and likely for next 2-3 weeks   PAIN:  Are you having pain? Yes: NPRS scale: 0/10 at rest, in past week at worst up to 3/10 Pain location: Rt dorsal scaphoid area of wrist Pain description: Aching but sometimes sharp Aggravating factors: Extending her wrist or attempted weightbearing Relieving factors: Rest and immobilization  FALLS: Has patient fallen in last 6 months? Yes. Number of falls 1 (this accident, no a fall risk)   PLOF: Independent  PATIENT GOALS: To improve the use and pain in her right dominant hand and arm  NEXT MD VISIT: 08/29/2023    OBJECTIVE: (All objective assessments below are from initial evaluation on: 07/06/23 unless otherwise specified.)   HAND DOMINANCE: Right   ADLs: Overall ADLs: States decreased ability to grab, hold household objects, pain and difficulty to open containers, perform FMS tasks (manipulate fasteners on clothing), mild to moderate bathing problems as well.    FUNCTIONAL OUTCOME MEASURES: Eval: Patient Specific Functional Scale: 4.8 /10  (typing, fix daughter's hair, wash dishes)  (Higher Score  =  Better Ability for the Selected Tasks)     UPPER EXTREMITY ROM     Shoulder to Wrist AROM Right eval  Forearm supination 40  Forearm pronation  80  Wrist flexion 29  Wrist extension 26  Wrist ulnar deviation 27  Wrist radial deviation 4  Functional dart thrower's motion (F-DTM) in ulnar flexion 30  F-DTM in radial extension  45  (Blank rows = not tested)   Hand AROM Right eval  Full Fist Ability (or Gap to Distal Palmar Crease) Full but loose  Thumb Opposition   (Kapandji Scale)  7/10  Thumb MCP (0-60)   Thumb IP (0-80)   Thumb Radial Abduction Span   Thumb Palmar Abduction Span   (Blank rows = not tested)   UPPER EXTREMITY MMT:    Eval:  NT at eval due to recent and still healing injuries. Will be tested when appropriate.   MMT Right TBD  Shoulder flexion   Shoulder abduction   Shoulder adduction   Shoulder extension   Shoulder internal rotation   Shoulder external rotation   Middle trapezius   Lower trapezius   Elbow flexion   Elbow extension   Forearm supination   Forearm pronation   Wrist flexion   Wrist extension   Wrist ulnar deviation   Wrist radial deviation   (Blank rows = not tested)  HAND FUNCTION: Eval: Observed weakness in affected Rt hand.  Grip strength Right: TBD lbs, Left: TBD lbs   COORDINATION: Eval: Observed coordination impairments with affected Rt hand. 9 Hole Peg Test Right: 51sec (23 sec is WFL)   SENSATION: Eval:  Light touch intact today but slightly diminished in Rt thumb volar and dorsal   EDEMA:   Eval:  Mildly swollen in Rt hand and wrist today, 16.3cm circumferentially around Rt distal wrist crease compared to 15.3cm in Lt wrist  COGNITION: Eval: Overall cognitive status: WFL for evaluation today   OBSERVATIONS:   Eval: She has some stiffness, weakness and apprehension when moving her right dominant hand and wrist.  She is not overly tender to palpation dorsally and volarly on the scaphoid has no tenderness to palpation.  Thumb motion is slightly aggravating.  She does have a guarding posture and even tension through her shoulder and biceps. Rt scaphoid fx   TODAY'S TREATMENT:  Post-evaluation treatment:   For safety/self-care OT educates that she should do no pushing pulling gripping or squeezing with the right hand now or for at least the next 2 to 3 weeks or until therapy advises her.  She should wear her prefabricated brace at all times except hygiene and exercises that should be  done approximately every 2 hours.  These exercises are listed below and were explained to her, demonstrated to her and she demonstrates back with no significant pain and no questions afterwards.  She may try typing and other light functional use of her hand inside of her brace and can even remove her brace when at home and simply resting, but she should have it on at all other times at least for 1-2 more weeks for safety.  Exercises - Reach arms upward   - 4-6 x daily - 10 reps - Turn [employer] Facing Up & Down  - 4-6 x daily - 10-15 reps - Bend and Pull Back Wrist SLOWLY  - 4 x daily - 10-15 reps - Wrist AROM Dart Throwers Motion  - 4 x daily - 10-15 reps - Tendon Glides  - 4-6 x daily -  3-5 reps - 2-3 seconds hold - Thumb Opposition  - 4-6 x daily - 10 reps    PATIENT EDUCATION: Education details: See tx section above for details  Person educated: Patient Education method: Verbal Instruction, Teach back, Handouts  Education comprehension: States and demonstrates understanding, Additional Education required    HOME EXERCISE PROGRAM: Access Code: [email or web address] URL: [email or web address] Date: 08/05/2023 Prepared by: Fannie Knee   GOALS: Goals reviewed with patient? Yes   SHORT TERM GOALS: (STG required if POC>30 days) Target Date: 08/16/23  Pt will obtain protective, custom orthotic. Goal status: TBD/PRN  2.  Pt will demo/state understanding of initial HEP to improve pain levels and prerequisite motion. Goal status: INITIAL   LONG TERM GOALS: Target Date: 09/20/23  Pt will improve functional ability by decreased impairment per PSFS assessment from 4.8/10 to 8/10 or better, for better quality of life. Goal status: INITIAL  2.  Pt will improve grip strength in right hand to at least 35 lbs for functional use at home and in IADLs. Goal status: INITIAL  3.  Pt will improve A/ROM in right wrist flexion/extension from 29/26 to at least 65 degrees for each, to have  functional motion for tasks like reach and grasp.  Goal status: INITIAL  4.  Pt will improve strength in right wrist flexion/extension from 3 -/5 MMT to at least 4+/5 MMT to have increased functional ability to carry out selfcare and higher-level homecare tasks with less difficulty. Goal status: INITIAL  5.  Pt will improve coordination skills in right hand, as seen by within functional limit score on nine-hole peg testing to have increased functional ability to carry out fine motor tasks (fasteners, etc.) and more complex, coordinated IADLs (meal prep, sports, etc.).  Goal status: INITIAL    ASSESSMENT:  CLINICAL IMPRESSION: Patient is a 27y.o. female who was seen today for occupational therapy evaluation for conservative healing of scaphoid fracture and right wrist.  She has subsequent swelling, stiffness, weakness and decreased function ability, but will benefit from outpatient occupational therapy to safely guide her towards full occupational participation.   PERFORMANCE DEFICITS: in functional skills including ADLs, IADLs, coordination, dexterity, proprioception, edema, ROM, strength, pain, fascial restrictions, muscle spasms, flexibility, Fine motor control, Gross motor control, body mechanics, endurance, decreased knowledge of precautions, and UE functional use, cognitive skills including problem solving and safety awareness, and psychosocial skills including coping strategies, environmental adaptation, and habits.   IMPAIRMENTS: are limiting patient from ADLs, IADLs, work, leisure, and social participation.   COMORBIDITIES: may have co-morbidities  that affects occupational performance. Patient will benefit from skilled OT to address above impairments and improve overall function.  MODIFICATION OR ASSISTANCE TO COMPLETE EVALUATION: No modification of tasks or assist necessary to complete an evaluation.  OT OCCUPATIONAL PROFILE AND HISTORY: Problem focused assessment: Including review  of records relating to presenting problem.  CLINICAL DECISION MAKING: Moderate - several treatment options, min-mod task modification necessary  REHAB POTENTIAL: Excellent  EVALUATION COMPLEXITY: Low      PLAN:  OT FREQUENCY: 1-2x/week  OT DURATION: 6 weeks through 09/20/2023 and up to 8 total visits as needed  PLANNED INTERVENTIONS: 97168 OT Re-evaluation, 97535 self care/ADL training, 59563 therapeutic exercise, 97530 therapeutic activity, 97112 neuromuscular re-education, 97140 manual therapy, 97035 ultrasound, 97039 fluidotherapy, 97010 moist heat, 97010 cryotherapy, 97034 contrast bath, 97760 Orthotics management and training, 87564 Splinting (initial encounter), [id number] Subsequent splinting/medication, compression bandaging, Dry needling, coping strategies training, and patient/family education  RECOMMENDED OTHER SERVICES: none now   CONSULTED  AND AGREED WITH PLAN OF CARE: Patient  PLAN FOR NEXT SESSION:   Review home exercise program and initial recommendations, ensure swelling and pain and stiffness are all improving.  Upgrade to light stretches if so and consider strengthening around the 8-week mark   Fannie Knee, OTR/L, CHT 08/05/2023, 5:04 PM

## 2023-08-05 ENCOUNTER — Ambulatory Visit: Payer: 59 | Admitting: Rehabilitative and Restorative Service Providers"

## 2023-08-05 ENCOUNTER — Encounter: Payer: Self-pay | Admitting: Rehabilitative and Restorative Service Providers"

## 2023-08-05 ENCOUNTER — Other Ambulatory Visit: Payer: Self-pay

## 2023-08-05 DIAGNOSIS — R600 Localized edema: Secondary | ICD-10-CM

## 2023-08-05 DIAGNOSIS — M6281 Muscle weakness (generalized): Secondary | ICD-10-CM | POA: Diagnosis not present

## 2023-08-05 DIAGNOSIS — M25531 Pain in right wrist: Secondary | ICD-10-CM | POA: Diagnosis not present

## 2023-08-05 DIAGNOSIS — M25631 Stiffness of right wrist, not elsewhere classified: Secondary | ICD-10-CM | POA: Diagnosis not present

## 2023-08-05 DIAGNOSIS — R278 Other lack of coordination: Secondary | ICD-10-CM

## 2023-08-14 ENCOUNTER — Encounter: Payer: 59 | Admitting: Rehabilitative and Restorative Service Providers"

## 2023-08-14 ENCOUNTER — Encounter: Payer: 59 | Admitting: Physical Therapy

## 2023-08-14 NOTE — Therapy (Signed)
OUTPATIENT OCCUPATIONAL THERAPY TREATMENT NOTE  Patient Name: Emily Frazier MRN: [id number] DOB:05/23/1996, 27 y.o., female Today's Date: 08/15/2023  PCP: NA REFERRING PROVIDER: Persons, [place], [place]   END OF SESSION:  OT End of Session - 08/15/23 1108     Visit Number 2    Number of Visits 8    Date for OT Re-Evaluation 09/20/23    Authorization Type [employer]    OT Start Time 1108    OT Stop Time 1143    OT Time Calculation (min) 35 min    Activity Tolerance Patient tolerated treatment well;No increased pain;Patient limited by fatigue;Patient limited by pain    Behavior During Therapy [facility] for tasks assessed/performed              Past Medical History:  Diagnosis Date   Pregnancy induced hypertension    Past Surgical History:  Procedure Laterality Date   WISDOM TOOTH EXTRACTION     Patient Active Problem List   Diagnosis Date Noted   Scaphoid fracture, wrist, closed 07/02/2023   Labor and delivery, indication for care 06/14/2018   History of pre-eclampsia in prior pregnancy, currently pregnant 12/02/2017   History of prior pregnancy with IUGR newborn 12/02/2017    ONSET DATE: DOI: 06/23/23  REFERRING DIAG: S62.001A (ICD-10-CM) - Closed nondisplaced fracture of scaphoid of right wrist, unspecified portion of scaphoid, initial encounter   THERAPY DIAG:  Localized edema  Muscle weakness (generalized)  Pain in right wrist  Stiffness of right wrist, not elsewhere classified  Other lack of coordination  Rationale for Evaluation and Treatment: Rehabilitation  PERTINENT HISTORY: Right hand therapy, hx rt scaphoid fx  She states not having any pain at rest but more like feeling stiffness.  When taking off her brace and moving in certain ways she does get a sharp pain through the dorsum of her scaphoid area by the thumb.  She works from home and she cares for her daughter and these things have been difficult since breaking her wrist..   PRECAUTIONS: None;  RED  FLAGS:  None   WEIGHT BEARING RESTRICTIONS: Yes NWB Rt wrist now and likely for next 2-3 weeks     SUBJECTIVE:   SUBJECTIVE STATEMENT: She is 7+ weeks post fall and nondisplaced scaphoid fracture of the right wrist now.   She states feeling better and now she can do the dishes without any pain.     PAIN:  Are you having pain? Yes: NPRS scale:  0/10 at rest, in past week at worst up to 4/10 Pain location: Rt dorsal scaphoid area of wrist Pain description: Aching but sometimes sharp Aggravating factors: Extending her wrist or attempted weightbearing Relieving factors: Rest and immobilization  FALLS: Has patient fallen in last 6 months? Yes. Number of falls 1 (this accident, no a fall risk)     NEXT MD VISIT: 08/29/2023    OBJECTIVE: (All objective assessments below are from initial evaluation on: 07/06/23 unless otherwise specified.)   HAND DOMINANCE: Right   ADLs: Overall ADLs: States decreased ability to grab, hold household objects, pain and difficulty to open containers, perform FMS tasks (manipulate fasteners on clothing), mild to moderate bathing problems as well.    FUNCTIONAL OUTCOME MEASURES: Eval: Patient Specific Functional Scale: 4.8 /10  (typing, fix daughter's hair, wash dishes)  (Higher Score  =  Better Ability for the Selected Tasks)     UPPER EXTREMITY ROM     Shoulder to Wrist AROM Right eval Rt 08/15/23  Forearm supination 40  74  Forearm pronation  80 86  Wrist flexion 29 41  Wrist extension 26 52  Wrist ulnar deviation 27 31  Wrist radial deviation 4 9  Functional dart thrower's motion (F-DTM) in ulnar flexion 30 36  F-DTM in radial extension  45 59  (Blank rows = not tested)   Hand AROM Right eval  Full Fist Ability (or Gap to Distal Palmar Crease) Full but loose  Thumb Opposition  (Kapandji Scale)  7/10  Thumb MCP (0-60)   Thumb IP (0-80)   Thumb Radial Abduction Span   Thumb Palmar Abduction Span   (Blank rows = not tested)   UPPER  EXTREMITY MMT:    Eval:  NT at eval due to recent and still healing injuries. Will be tested when appropriate.   MMT Right TBD  Shoulder flexion   Shoulder abduction   Shoulder adduction   Shoulder extension   Shoulder internal rotation   Shoulder external rotation   Middle trapezius   Lower trapezius   Elbow flexion   Elbow extension   Forearm supination   Forearm pronation   Wrist flexion   Wrist extension   Wrist ulnar deviation   Wrist radial deviation   (Blank rows = not tested)  HAND FUNCTION: Eval: Observed weakness in affected Rt hand.  Grip strength Right: TBD lbs, Left: TBD lbs   COORDINATION: Eval: Observed coordination impairments with affected Rt hand. 9 Hole Peg Test Right: 51sec (23 sec is WFL)   SENSATION: Eval:  Light touch intact today but slightly diminished in Rt thumb volar and dorsal   EDEMA:   Eval:  Mildly swollen in Rt hand and wrist today, 16.3cm circumferentially around Rt distal wrist crease compared to 15.3cm in Lt wrist  OBSERVATIONS:   Eval: She has some stiffness, weakness and apprehension when moving her right dominant hand and wrist.  She is not overly tender to palpation dorsally and volarly on the scaphoid has no tenderness to palpation.  Thumb motion is slightly aggravating.  She does have a guarding posture and even tension through her shoulder and biceps. Rt scaphoid fx   TODAY'S TREATMENT:  08/15/23: She starts with active range of motion for exercise as well as new measures which shows improvements since last seen.  She has no new swelling or tenderness and so we will advance to light stretching now.  While she is on moist heat for 4 minutes, OT educates her on new stretching program as listed below.  After moist heat she states feeling a bit better and looser, and OT does manual therapy IASTM to the dorsum of the forearm and wrist and hand with concurrent wrist flexion stretches manually.  Next OT leads her to her all of her  stretches performing with her and having her perform back for understanding.  She tolerates all of these well including light thumb stretches with caution.  She was then educated to finish by doing 5-10 repetitions of the motion exercises that she had learned previously.  She can use ice at the end if she has any soreness.  Today she feels great, put back on her brace.  We discussed weaning her brace for light functional activities for 10 or 15 minutes at a time if well-tolerated.  She can slowly upgrade this weaning process over the next 1 to 2 weeks as tolerated.   Exercises - Turn [employer] Facing Up & Down  - 4-6 x daily - 10-15 reps - Bend and Pull Back Wrist SLOWLY  -  4 x daily - 10-15 reps - Wrist AROM Dart Throwers Motion  - 4 x daily - 10-15 reps - Tendon Glides  - 4-6 x daily - 3-5 reps - 2-3 seconds hold - Thumb Opposition  - 4-6 x daily - 10 reps - Forearm Supination Stretch  - 3-4 x daily - 3-5 reps - 15 sec hold - Wrist Flexion Stretch  - 4 x daily - 3-5 reps - 15 sec hold - Wrist Extension Stretch Pronated  - 4 x daily - 3-5 reps - 15 hold - Wrist Prayer Stretch  - 4 x daily - 3-5 reps - 15 sec hold - Thumb stretch  - 4 x daily - 3-5 reps - 15-20 sec hold  PATIENT EDUCATION: Education details: See tx section above for details  Person educated: Patient Education method: Verbal Instruction, Teach back, Handouts  Education comprehension: States and demonstrates understanding, Additional Education required    HOME EXERCISE PROGRAM: Access Code: [email or web address] URL: [email or web address] Date: 08/05/2023 Prepared by: Fannie Knee   GOALS: Goals reviewed with patient? Yes   SHORT TERM GOALS: (STG required if POC>30 days) Target Date: 08/16/23  Pt will obtain protective, custom orthotic. Goal status: TBD/PRN  2.  Pt will demo/state understanding of initial HEP to improve pain levels and prerequisite motion. Goal status: INITIAL   LONG TERM GOALS: Target Date:  09/20/23  Pt will improve functional ability by decreased impairment per PSFS assessment from 4.8/10 to 8/10 or better, for better quality of life. Goal status: INITIAL  2.  Pt will improve grip strength in right hand to at least 35 lbs for functional use at home and in IADLs. Goal status: INITIAL  3.  Pt will improve A/ROM in right wrist flexion/extension from 29/26 to at least 65 degrees for each, to have functional motion for tasks like reach and grasp.  Goal status: INITIAL  4.  Pt will improve strength in right wrist flexion/extension from 3 -/5 MMT to at least 4+/5 MMT to have increased functional ability to carry out selfcare and higher-level homecare tasks with less difficulty. Goal status: INITIAL  5.  Pt will improve coordination skills in right hand, as seen by within functional limit score on nine-hole peg testing to have increased functional ability to carry out fine motor tasks (fasteners, etc.) and more complex, coordinated IADLs (meal prep, sports, etc.).  Goal status: INITIAL    ASSESSMENT:  CLINICAL IMPRESSION: 08/15/23: No tenderness or pain, motion improving, now tolerating light stretches.  Carry on  Eval: Patient is a 27y.o. female who was seen today for occupational therapy evaluation for conservative healing of scaphoid fracture and right wrist.  She has subsequent swelling, stiffness, weakness and decreased function ability, but will benefit from outpatient occupational therapy to safely guide her towards full occupational participation.      PLAN:  OT FREQUENCY: 1-2x/week  OT DURATION: 6 weeks through 09/20/2023 and up to 8 total visits as needed  PLANNED INTERVENTIONS: 97168 OT Re-evaluation, 97535 self care/ADL training, 16109 therapeutic exercise, 97530 therapeutic activity, 97112 neuromuscular re-education, 97140 manual therapy, 97035 ultrasound, 97039 fluidotherapy, 97010 moist heat, 97010 cryotherapy, 97034 contrast bath, 97760 Orthotics management and  training, 60454 Splinting (initial encounter), [id number] Subsequent splinting/medication, compression bandaging, Dry needling, coping strategies training, and patient/family education  CONSULTED AND AGREED WITH PLAN OF CARE: Patient  PLAN FOR NEXT SESSION:   Continue to progress range of motion and stretches as well as light functional activities and consider strengthening around the 8-week mark  Fannie Knee, OTR/L, CHT 08/15/2023, 12:48 PM

## 2023-08-15 ENCOUNTER — Encounter: Payer: Self-pay | Admitting: Rehabilitative and Restorative Service Providers"

## 2023-08-15 ENCOUNTER — Ambulatory Visit (INDEPENDENT_AMBULATORY_CARE_PROVIDER_SITE_OTHER): Payer: 59 | Admitting: Rehabilitative and Restorative Service Providers"

## 2023-08-15 DIAGNOSIS — R600 Localized edema: Secondary | ICD-10-CM

## 2023-08-15 DIAGNOSIS — M25531 Pain in right wrist: Secondary | ICD-10-CM

## 2023-08-15 DIAGNOSIS — M25631 Stiffness of right wrist, not elsewhere classified: Secondary | ICD-10-CM

## 2023-08-15 DIAGNOSIS — M6281 Muscle weakness (generalized): Secondary | ICD-10-CM | POA: Diagnosis not present

## 2023-08-15 DIAGNOSIS — R278 Other lack of coordination: Secondary | ICD-10-CM

## 2023-08-16 ENCOUNTER — Encounter: Payer: 59 | Admitting: Rehabilitative and Restorative Service Providers"

## 2023-08-22 ENCOUNTER — Encounter: Payer: 59 | Admitting: Rehabilitative and Restorative Service Providers"

## 2023-08-27 ENCOUNTER — Encounter: Payer: 59 | Admitting: Rehabilitative and Restorative Service Providers"

## 2023-08-27 NOTE — Therapy (Signed)
 OUTPATIENT OCCUPATIONAL THERAPY TREATMENT NOTE  Patient Name: Emily Frazier MRN: [id number] DOB:11/18/1995, 27 y.o., female Today's Date: 08/29/2023  PCP: NA REFERRING PROVIDER: Persons, Ronal Dragon, [place]   END OF SESSION:  OT End of Session - 08/15/23 1108     Visit Number 2    Number of Visits 8    Date for OT Re-Evaluation 09/20/23    Authorization Type [employer]    OT Start Time 1108    OT Stop Time 1143    OT Time Calculation (min) 35 min    Activity Tolerance Patient tolerated treatment well;No increased pain;Patient limited by fatigue;Patient limited by pain    Behavior During Therapy [facility] for tasks assessed/performed              Past Medical History:  Diagnosis Date   Pregnancy induced hypertension    Past Surgical History:  Procedure Laterality Date   WISDOM TOOTH EXTRACTION     Patient Active Problem List   Diagnosis Date Noted   Scaphoid fracture, wrist, closed 07/02/2023   Labor and delivery, indication for care 06/14/2018   History of pre-eclampsia in prior pregnancy, currently pregnant 12/02/2017   History of prior pregnancy with IUGR newborn 12/02/2017    ONSET DATE: DOI: 06/23/23  REFERRING DIAG: S62.001A (ICD-10-CM) - Closed nondisplaced fracture of scaphoid of right wrist, unspecified portion of scaphoid, initial encounter   THERAPY DIAG:  Localized edema  Muscle weakness (generalized)  Pain in right wrist  Stiffness of right wrist, not elsewhere classified  Other lack of coordination  Rationale for Evaluation and Treatment: Rehabilitation  PERTINENT HISTORY: Right hand therapy, hx rt scaphoid fx  She states not having any pain at rest but more like feeling stiffness.  When taking off her brace and moving in certain ways she does get a sharp pain through the dorsum of her scaphoid area by the thumb.  She works from home and she cares for her daughter and these things have been difficult since breaking her wrist..   PRECAUTIONS: None;  RED  FLAGS:  None   WEIGHT BEARING RESTRICTIONS: Yes NWB Rt wrist now and likely for next 2-3 weeks     SUBJECTIVE:   SUBJECTIVE STATEMENT: She is 9 weeks post fall and nondisplaced scaphoid fracture of the right wrist now.  Arrives slightly late, states the dorsum of her wrist does hurt with stretches but doing more fnl activities.    PAIN:  Are you having pain? Yes: NPRS scale:  0/10 at rest, and none in past week  Pain location: Rt dorsal scaphoid area of wrist Pain description: Aching but sometimes sharp Aggravating factors: Extending her wrist or attempted weightbearing Relieving factors: Rest and immobilization  FALLS: Has patient fallen in last 6 months? Yes. Number of falls 1 (this accident, no a fall risk)     NEXT MD VISIT: 08/29/2023    OBJECTIVE: (All objective assessments below are from initial evaluation on: 07/06/23 unless otherwise specified.)   HAND DOMINANCE: Right   ADLs: Overall ADLs: States decreased ability to grab, hold household objects, pain and difficulty to open containers, perform FMS tasks (manipulate fasteners on clothing), mild to moderate bathing problems as well.    FUNCTIONAL OUTCOME MEASURES: Eval: Patient Specific Functional Scale: 4.8 /10  (typing, fix daughter's hair, wash dishes)  (Higher Score  =  Better Ability for the Selected Tasks)     UPPER EXTREMITY ROM     Shoulder to Wrist AROM Right eval Rt 08/15/23 Rt 08/29/23  Forearm supination 40 74 80  Forearm pronation  80 86 83  Wrist flexion 29 41 48  Wrist extension 26 52 61  Wrist ulnar deviation 27 31 31   Wrist radial deviation 4 9 (-5)  Functional dart thrower's motion (F-DTM) in ulnar flexion 30 36   F-DTM in radial extension  45 59   (Blank rows = not tested)   Hand AROM Right eval  Full Fist Ability (or Gap to Distal Palmar Crease) Full but loose  Thumb Opposition  (Kapandji Scale)  7/10  Thumb MCP (0-60)   Thumb IP (0-80)   Thumb Radial Abduction Span   Thumb  Palmar Abduction Span   (Blank rows = not tested)   UPPER EXTREMITY MMT:    Eval:  NT at eval due to recent and still healing injuries. Will be tested when appropriate.   MMT Right TBD  Shoulder flexion   Shoulder abduction   Shoulder adduction   Shoulder extension   Shoulder internal rotation   Shoulder external rotation   Middle trapezius   Lower trapezius   Elbow flexion   Elbow extension   Forearm supination   Forearm pronation   Wrist flexion   Wrist extension   Wrist ulnar deviation   Wrist radial deviation   (Blank rows = not tested)  HAND FUNCTION: 09/02/23: Grip strength Right: 45 lbs, Left: 80 lbs   Eval: Observed weakness in affected Rt hand.   COORDINATION: Eval: Observed coordination impairments with affected Rt hand. 9 Hole Peg Test Right: 51sec (23 sec is WFL)   SENSATION: Eval:  Light touch intact today but slightly diminished in Rt thumb volar and dorsal   EDEMA:   Eval:  Mildly swollen in Rt hand and wrist today, 16.3cm circumferentially around Rt distal wrist crease compared to 15.3cm in Lt wrist  OBSERVATIONS:   Eval: She has some stiffness, weakness and apprehension when moving her right dominant hand and wrist.  She is not overly tender to palpation dorsally and volarly on the scaphoid has no tenderness to palpation.  Thumb motion is slightly aggravating.  She does have a guarding posture and even tension through her shoulder and biceps. Rt scaphoid fx   TODAY'S TREATMENT:  08/29/23: She started with active range of motion for review of exercises as well as new measures she wishes good improvement in the forearm and the wrist today in all planes of motion except for radial deviation which is slightly lagging.  While she is on moist heat for 5 minutes, we review her home exercises and OT educates her on several new exercises including spatula stretch for radial deviation as well as new therapy putty activities to be performed twice a day to start  strengthening her hand now that she is nonpainful in 9 weeks post fracture.  She was educated to do these slowly, cautiously, nonpainfully.  For her safety/self-care she was educated that she should use caution through 12 weeks post fracture and wear her prefabricated brace outside of the home during that time, however she can start leaving it off more and more as she weans from home brace during the day.    She tolerates all no stretches well today as well as new therapy putty activities with no increase in pain and leaves stating feeling better.  Exercises - Forearm Supination Stretch  - 3-4 x daily - 3-5 reps - 15 sec hold - Wrist Flexion Stretch  - 4 x daily - 3-5 reps - 15 sec hold -  Wrist Prayer Stretch  - 4 x daily - 3-5 reps - 15 sec hold - SPATULA stretch   - 4 x daily - 3 reps - 15 hold - Thumb stretch  - 4 x daily - 3-5 reps - 15-20 sec hold - Full Fist  - 2 x daily - 5 reps - 10 sec hold - Thumb Press  - 2 x daily - 5 reps - 10 sec hold - Thumb Opposition with Putty  - 2-3 x daily - 2 reps - Finger Pinch and Pull with Putty  - 2 x daily - 5 reps   PATIENT EDUCATION: Education details: See tx section above for details  Person educated: Patient Education method: Verbal Instruction, Teach back, Handouts  Education comprehension: States and demonstrates understanding, Additional Education required    HOME EXERCISE PROGRAM: Access Code: [email or web address] URL: [email or web address] Date: 08/05/2023 Prepared by: Melvenia Ada   GOALS: Goals reviewed with patient? Yes   SHORT TERM GOALS: (STG required if POC>30 days) Target Date: 08/16/23  Pt will obtain protective, custom orthotic. Goal status: TBD/PRN  2.  Pt will demo/state understanding of initial HEP to improve pain levels and prerequisite motion. Goal status: 09/02/23: MET   LONG TERM GOALS: Target Date: 09/20/23  Pt will improve functional ability by decreased impairment per PSFS assessment from 4.8/10 to  8/10 or better, for better quality of life. Goal status: INITIAL  2.  Pt will improve grip strength in right hand to at least 35 lbs for functional use at home and in IADLs. Goal status: INITIAL  3.  Pt will improve A/ROM in right wrist flexion/extension from 29/26 to at least 65 degrees for each, to have functional motion for tasks like reach and grasp.  Goal status: INITIAL  4.  Pt will improve strength in right wrist flexion/extension from 3 -/5 MMT to at least 4+/5 MMT to have increased functional ability to carry out selfcare and higher-level homecare tasks with less difficulty. Goal status: INITIAL  5.  Pt will improve coordination skills in right hand, as seen by within functional limit score on nine-hole peg testing to have increased functional ability to carry out fine motor tasks (fasteners, etc.) and more complex, coordinated IADLs (meal prep, sports, etc.).  Goal status: INITIAL    ASSESSMENT:  CLINICAL IMPRESSION: 08/29/23: She is doing great and she is less tender and not really painful also tolerating grip training now.  Carry on and consider adding light wrist training in the next 1-2 visits as tolerated   PLAN:  OT FREQUENCY: 1-2x/week  OT DURATION: 6 weeks through 09/20/2023 and up to 8 total visits as needed  PLANNED INTERVENTIONS: 97168 OT Re-evaluation, 97535 self care/ADL training, 02889 therapeutic exercise, 97530 therapeutic activity, 97112 neuromuscular re-education, 97140 manual therapy, 97035 ultrasound, 97039 fluidotherapy, 97010 moist heat, 97010 cryotherapy, 97034 contrast bath, 97760 Orthotics management and training, 02239 Splinting (initial encounter), [id number] Subsequent splinting/medication, compression bandaging, Dry needling, coping strategies training, and patient/family education  CONSULTED AND AGREED WITH PLAN OF CARE: Patient  PLAN FOR NEXT SESSION:   Check on new putty activities, consider adding wrist strengthening in the next 1-2 sessions as  long as nontender nonpainful.   Melvenia Ada, OTR/L, CHT 08/29/2023, 11:46 AM

## 2023-08-29 ENCOUNTER — Encounter: Payer: Self-pay | Admitting: Rehabilitative and Restorative Service Providers"

## 2023-08-29 ENCOUNTER — Encounter: Payer: Self-pay | Admitting: Physician Assistant

## 2023-08-29 ENCOUNTER — Ambulatory Visit: Payer: 59 | Admitting: Physician Assistant

## 2023-08-29 ENCOUNTER — Ambulatory Visit (INDEPENDENT_AMBULATORY_CARE_PROVIDER_SITE_OTHER): Payer: 59 | Admitting: Rehabilitative and Restorative Service Providers"

## 2023-08-29 DIAGNOSIS — R600 Localized edema: Secondary | ICD-10-CM

## 2023-08-29 DIAGNOSIS — M6281 Muscle weakness (generalized): Secondary | ICD-10-CM

## 2023-08-29 DIAGNOSIS — M25631 Stiffness of right wrist, not elsewhere classified: Secondary | ICD-10-CM

## 2023-08-29 DIAGNOSIS — R278 Other lack of coordination: Secondary | ICD-10-CM | POA: Diagnosis not present

## 2023-08-29 DIAGNOSIS — M25531 Pain in right wrist: Secondary | ICD-10-CM

## 2023-08-29 DIAGNOSIS — S62001D Unspecified fracture of navicular [scaphoid] bone of right wrist, subsequent encounter for fracture with routine healing: Secondary | ICD-10-CM

## 2023-08-29 NOTE — Progress Notes (Signed)
 Office Visit Note   Patient: Emily Frazier           Date of Birth: 1995/09/28           MRN: [id number] Visit Date: 08/29/2023              Requested by: No referring provider defined for this encounter. PCP: Patient, No Pcp Per  Chief Complaint  Patient presents with   Right Wrist - Follow-up      HPI: Patient is a pleasant 28 year old woman who is here to follow-up on her right wrist nondisplaced scaphoid fracture.  She is now 10 weeks since the injury she reports she is doing very very well she has been working with Nate in occupational therapy.  She really does not have any pain except with certain ulnar deviation and radial deviation and not really all that much.  She is appointment with occupational therapy today  Assessment & Plan: Visit Diagnoses:  1. Closed nondisplaced fracture of scaphoid of right wrist with routine healing, unspecified portion of scaphoid, subsequent encounter     Plan: She is doing much better.  She will finish working with [employer].  May follow-up as needed  Follow-Up Instructions: No follow-ups on file.   Ortho Exam  Patient is alert, oriented, no adenopathy, well-dressed, normal affect, normal respiratory effort. Examination of her wrist she is neurovascular intact has good strength in opposition of her fingers slightly stiff in extension and flexion a little stiffness and ulnar deviation.  Otherwise she is doing great no swelling no erythema  Imaging: No results found. No images are attached to the encounter.  Labs: Lab Results  Component Value Date   LABORGA NO GROUP B STREP (S.AGALACTIAE) ISOLATED 10/19/2014     Lab Results  Component Value Date   ALBUMIN 4.8 08/30/2018   ALBUMIN 4.1 08/14/2018   ALBUMIN 4.5 08/11/2018    No results found for: MG No results found for: VD25OH  No results found for: PREALBUMIN    Latest Ref Rng & Units 11/18/2019   12:08 AM 08/30/2018    5:24 AM 08/14/2018    3:25 AM  CBC EXTENDED   WBC 4.0 - 10.5 K/uL 7.3  12.0  7.9   RBC 3.87 - 5.11 MIL/uL 3.92  4.14  3.93   Hemoglobin 12.0 - 15.0 g/dL 87.5  87.5  88.4   HCT 36.0 - 46.0 % 37.3  37.8  35.8   Platelets 150 - 400 K/uL 256  281  215   NEUT# 1.7 - 7.7 K/uL   4.9   Lymph# 0.7 - 4.0 K/uL   1.9      There is no height or weight on file to calculate BMI.  Orders:  No orders of the defined types were placed in this encounter.  No orders of the defined types were placed in this encounter.    Procedures: No procedures performed  Clinical Data: No additional findings.  ROS:  All other systems negative, except as noted in the HPI. Review of Systems  Objective: Vital Signs: There were no vitals taken for this visit.  Specialty Comments:  No specialty comments available.  PMFS History: Patient Active Problem List   Diagnosis Date Noted   Scaphoid fracture, wrist, closed 07/02/2023   Labor and delivery, indication for care 06/14/2018   History of pre-eclampsia in prior pregnancy, currently pregnant 12/02/2017   History of prior pregnancy with IUGR newborn 12/02/2017   Past Medical History:  Diagnosis Date  Pregnancy induced hypertension     Family History  Problem Relation Age of Onset   Cancer Sister 6   Arthritis Neg Hx    Diabetes Neg Hx    Heart disease Neg Hx    Hypertension Neg Hx    Stroke Neg Hx     Past Surgical History:  Procedure Laterality Date   WISDOM TOOTH EXTRACTION     Social History   Occupational History   Not on file  Tobacco Use   Smoking status: Former    Current packs/day: 0.00    Types: Cigarettes    Quit date: 08/27/2016    Years since quitting: 7.0   Smokeless tobacco: Never  Vaping Use   Vaping status: Never Used  Substance and Sexual Activity   Alcohol use: No   Drug use: No   Sexual activity: Yes    Birth control/protection: Implant

## 2023-09-03 NOTE — Therapy (Signed)
 OUTPATIENT OCCUPATIONAL THERAPY TREATMENT NOTE  Patient Name: Emily Frazier MRN: [id number] DOB:July 26, 1996, 28 y.o., female Today's Date: 09/04/2023  PCP: NA REFERRING PROVIDER: Persons, Ronal Dragon, [place]            OCCUPATIONAL THERAPY DISCHARGE SUMMARY  Visits from Start of Care: 4  Current functional level related to goals / functional outcomes:  09/04/23: She now has met all of her long-term goals, has no pain or tenderness, has weaned away from all bracing, and now tolerates arm strengthening very well.  We will successfully discharge today  Education / Equipment: Pt has all needed materials and education. Pt understands how to continue on with self-management. See tx notes for more details.   Patient agrees to discharge due to max benefits received from outpatient occupational therapy / hand therapy at this time.   Melvenia Ada, OTR/L, CHT 09/04/23       END OF SESSION:  OT End of Session - 08/15/23 1108     Visit Number 2    Number of Visits 8    Date for OT Re-Evaluation 09/20/23    Authorization Type [employer]et[employer]    OT Start Time 1108    OT Stop Time 1143    OT Time Calculation (min) 35 min    Activity Tolerance Patient tolerated treatment well;No increased pain;Patient limited by fatigue;Patient limited by pain    Behavior During Therapy [facility] for tasks assessed/performed              Past Medical History:  Diagnosis Date   Pregnancy induced hypertension    Past Surgical History:  Procedure Laterality Date   WISDOM TOOTH EXTRACTION     Patient Active Problem List   Diagnosis Date Noted   Scaphoid fracture, wrist, closed 07/02/2023   Labor and delivery, indication for care 06/14/2018   History of pre-eclampsia in prior pregnancy, currently pregnant 12/02/2017   History of prior pregnancy with IUGR newborn 12/02/2017    ONSET DATE: DOI: 06/23/23  REFERRING DIAG: S62.001A (ICD-10-CM) - Closed nondisplaced fracture of scaphoid of right wrist,  unspecified portion of scaphoid, initial encounter   THERAPY DIAG:  Localized edema  Muscle weakness (generalized)  Pain in right wrist  Stiffness of right wrist, not elsewhere classified  Other lack of coordination  Rationale for Evaluation and Treatment: Rehabilitation  PERTINENT HISTORY: Right hand therapy, hx rt scaphoid fx  She states not having any pain at rest but more like feeling stiffness.  When taking off her brace and moving in certain ways she does get a sharp pain through the dorsum of her scaphoid area by the thumb.  She works from home and she cares for her daughter and these things have been difficult since breaking her wrist..   PRECAUTIONS: None;  RED FLAGS:  None   WEIGHT BEARING RESTRICTIONS: Yes NWB Rt wrist now and likely for next 2-3 weeks     SUBJECTIVE:   SUBJECTIVE STATEMENT: She is 10 weeks post fall and nondisplaced scaphoid fracture of the right wrist. She states pain in dorsum of wrist is better.  Has been weaning from her brace almost completely.    PAIN:  Are you having pain? None now    NEXT MD VISIT: 08/29/2023    OBJECTIVE: (All objective assessments below are from initial evaluation on: 07/06/23 unless otherwise specified.)   HAND DOMINANCE: Right   ADLs: Overall ADLs: States decreased ability to grab, hold household objects, pain and difficulty to open containers, perform FMS tasks (manipulate fasteners on  clothing), mild to moderate bathing problems as well.    FUNCTIONAL OUTCOME MEASURES: 09/04/23: PSFS: 8.5/10  Eval: Patient Specific Functional Scale: 4.8 /10  (typing, fix daughter's hair, wash dishes)  (Higher Score  =  Better Ability for the Selected Tasks)     UPPER EXTREMITY ROM     Shoulder to Wrist AROM Right eval Rt 09/04/23  Forearm supination 40   Forearm pronation  80   Wrist flexion 29 78  Wrist extension 26 64  Wrist ulnar deviation 27 45  Wrist radial deviation 4 15  Functional dart thrower's motion (F-DTM)  in ulnar flexion 30   F-DTM in radial extension  45   (Blank rows = not tested)   Hand AROM Right eval Rt 09/04/23  Full Fist Ability (or Gap to Distal Palmar Crease) Full but loose full  Thumb Opposition  (Kapandji Scale)  7/10 10/10  (Blank rows = not tested)   UPPER EXTREMITY MMT:     MMT Right 09/04/23  Elbow flexion 5/5  Elbow extension 5/5  Forearm supination 5/5  Forearm pronation 5/5  Wrist flexion 4+/5  Wrist extension 5/5  Wrist ulnar deviation   Wrist radial deviation   (Blank rows = not tested)  HAND FUNCTION: 09/04/23: Grip strength Right: 61 lbs   08/29/23: Grip strength Right: 45 lbs, Left: 80 lbs   COORDINATION: 09/04/23: 9HPT: 19sec WNL   Eval: Observed coordination impairments with affected Rt hand. 9 Hole Peg Test Right: 51sec (23 sec is WFL)    TODAY'S TREATMENT:  09/04/23: Pt performs AROM, gripping, and strength with Rt arm against therapist's resistance for exercise/activities as well as new measures today. OT also discusses home and functional tasks with the pt and reviews goals. Using the complied data, OT also reviews home exercises and provides updated recommendations and upgrades as bolded below.  She performs arm strengthening for 10-15 reps of each of the bolded exercises below with no significant pain and only getting mild fatigue today.  Pt states understanding and tolerates upgrades well.  She was educated to strengthen for the next 2 weeks, then at 12 weeks post fracture she can return to all normal activities cautiously as tolerated.  She states ready to discharge from therapy and is very happy about it   Exercises - Wrist Flexion Stretch  - 4 x daily - 1-2 reps - 15 sec hold - Wrist Prayer Stretch  - 4 x daily - 5 reps - 15 sec hold - SPATULA stretch   - 4 x daily - 3 reps - 15 hold - Full Fist  - 2 x daily - 5 reps - 10 sec hold - Thumb Press  - 2 x daily - 5 reps - 10 sec hold - Thumb Opposition with Putty  - 2-3 x daily - 2 reps - Finger  Pinch and Pull with Putty  - 2 x daily - 5 reps - Hammer Stretch or Strength   - 2-4 x daily - 1-2 sets - 10-15 reps - Wrist Extension with Resistance  - 2-4 x daily - 1-2 sets - 10-15 reps - Wrist Flexion with Resistance  - 2-4 x daily - 1-2 sets - 10-15 reps - Standing Bicep Curls with Resistance  - 2-4 x daily - 1-2 sets - 10-15 reps - Tricep Kick Back with Resistance  - 2-4 x daily - 1-2 sets - 10-15 reps   PATIENT EDUCATION: Education details: See tx section above for details  Person educated: Patient Education method:  Verbal Instruction, Teach back, Handouts  Education comprehension: States and demonstrates understanding   HOME EXERCISE PROGRAM: Access Code: [email or web address] URL: [email or web address] Date: 08/05/2023 Prepared by: Melvenia Ada   GOALS: Goals reviewed with patient? Yes   SHORT TERM GOALS: (STG required if POC>30 days) Target Date: 08/16/23  Pt will obtain protective, custom orthotic. Goal status: TBD/PRN  2.  Pt will demo/state understanding of initial HEP to improve pain levels and prerequisite motion. Goal status: 09/02/23: MET   LONG TERM GOALS: Target Date: 09/20/23  Pt will improve functional ability by decreased impairment per PSFS assessment from 4.8/10 to 8/10 or better, for better quality of life. Goal status: 09/04/23: MET  2.  Pt will improve grip strength in right hand to at least 35 lbs for functional use at home and in IADLs. Goal status: 09/04/23: MET  3.  Pt will improve A/ROM in right wrist flexion/extension from 29/26 to at least 65 degrees for each, to have functional motion for tasks like reach and grasp.  Goal status: 09/04/23: MET  4.  Pt will improve strength in right wrist flexion/extension from 3 -/5 MMT to at least 4+/5 MMT to have increased functional ability to carry out selfcare and higher-level homecare tasks with less difficulty. Goal status: 09/04/23: MET  5.  Pt will improve coordination skills in right hand, as  seen by within functional limit score on nine-hole peg testing to have increased functional ability to carry out fine motor tasks (fasteners, etc.) and more complex, coordinated IADLs (meal prep, sports, etc.).  Goal status: 09/04/23: MET    ASSESSMENT:  CLINICAL IMPRESSION: 09/04/23: She now has met all of her long-term goals, has no pain or tenderness, has weaned away from all bracing, and now tolerates arm strengthening very well.  We will successfully discharge today  PLAN:  OT FREQUENCY: & OT DURATION: Discharge  PLANNED INTERVENTIONS: 97168 OT Re-evaluation, 97535 self care/ADL training, 02889 therapeutic exercise, 97530 therapeutic activity, 97112 neuromuscular re-education, 97140 manual therapy, 97035 ultrasound, 97039 fluidotherapy, 97010 moist heat, 97010 cryotherapy, 97034 contrast bath, 97760 Orthotics management and training, 02239 Splinting (initial encounter), [id number] Subsequent splinting/medication, compression bandaging, Dry needling, coping strategies training, and patient/family education  CONSULTED AND AGREED WITH PLAN OF CARE: Patient  PLAN FOR NEXT SESSION:   N/A, discharge   Melvenia Ada, OTR/L, CHT 09/04/2023, 10:08 AM

## 2023-09-04 ENCOUNTER — Ambulatory Visit (INDEPENDENT_AMBULATORY_CARE_PROVIDER_SITE_OTHER): Payer: 59 | Admitting: Rehabilitative and Restorative Service Providers"

## 2023-09-04 ENCOUNTER — Encounter: Payer: Self-pay | Admitting: Rehabilitative and Restorative Service Providers"

## 2023-09-04 DIAGNOSIS — R278 Other lack of coordination: Secondary | ICD-10-CM

## 2023-09-04 DIAGNOSIS — M25631 Stiffness of right wrist, not elsewhere classified: Secondary | ICD-10-CM

## 2023-09-04 DIAGNOSIS — R600 Localized edema: Secondary | ICD-10-CM

## 2023-09-04 DIAGNOSIS — M25531 Pain in right wrist: Secondary | ICD-10-CM | POA: Diagnosis not present

## 2023-09-04 DIAGNOSIS — M6281 Muscle weakness (generalized): Secondary | ICD-10-CM | POA: Diagnosis not present

## 2023-09-11 ENCOUNTER — Encounter: Payer: 59 | Admitting: Rehabilitative and Restorative Service Providers"

## 2023-10-03 ENCOUNTER — Encounter: Payer: 59 | Admitting: Physical Therapy

## 2023-10-09 ENCOUNTER — Encounter: Payer: 59 | Admitting: Physical Therapy

## 2024-01-28 DIAGNOSIS — Z113 Encounter for screening for infections with a predominantly sexual mode of transmission: Secondary | ICD-10-CM | POA: Diagnosis not present

## 2024-01-28 DIAGNOSIS — Z3201 Encounter for pregnancy test, result positive: Secondary | ICD-10-CM | POA: Diagnosis not present

## 2024-01-28 DIAGNOSIS — N925 Other specified irregular menstruation: Secondary | ICD-10-CM | POA: Diagnosis not present

## 2024-02-19 DIAGNOSIS — Z3480 Encounter for supervision of other normal pregnancy, unspecified trimester: Secondary | ICD-10-CM | POA: Diagnosis not present

## 2024-02-19 LAB — OB RESULTS CONSOLE RUBELLA ANTIBODY, IGM: Rubella: IMMUNE

## 2024-02-19 LAB — OB RESULTS CONSOLE RPR: RPR: NONREACTIVE

## 2024-02-19 LAB — OB RESULTS CONSOLE HIV ANTIBODY (ROUTINE TESTING): HIV: NONREACTIVE

## 2024-02-19 LAB — OB RESULTS CONSOLE GC/CHLAMYDIA
Chlamydia: NEGATIVE
Neisseria Gonorrhea: NEGATIVE

## 2024-02-19 LAB — OB RESULTS CONSOLE HEPATITIS B SURFACE ANTIGEN: Hepatitis B Surface Ag: NEGATIVE

## 2024-02-19 LAB — HEPATITIS C ANTIBODY: HCV Ab: NEGATIVE

## 2024-03-18 DIAGNOSIS — Z3482 Encounter for supervision of other normal pregnancy, second trimester: Secondary | ICD-10-CM | POA: Diagnosis not present

## 2024-04-16 DIAGNOSIS — Z363 Encounter for antenatal screening for malformations: Secondary | ICD-10-CM | POA: Diagnosis not present

## 2024-04-16 DIAGNOSIS — Z3A19 19 weeks gestation of pregnancy: Secondary | ICD-10-CM | POA: Diagnosis not present

## 2024-06-29 ENCOUNTER — Encounter: Payer: Self-pay | Admitting: Radiology

## 2024-07-27 DIAGNOSIS — Z3A34 34 weeks gestation of pregnancy: Secondary | ICD-10-CM | POA: Diagnosis not present

## 2024-07-27 DIAGNOSIS — Z8759 Personal history of other complications of pregnancy, childbirth and the puerperium: Secondary | ICD-10-CM | POA: Diagnosis not present

## 2024-08-12 DIAGNOSIS — Z3480 Encounter for supervision of other normal pregnancy, unspecified trimester: Secondary | ICD-10-CM | POA: Diagnosis not present

## 2024-08-12 LAB — OB RESULTS CONSOLE GBS: GBS: NEGATIVE

## 2024-08-27 NOTE — L&D Delivery Note (Signed)
 Delivery Note Patient was admitted in active labor and progressed along a normal labor curve to 10/100/+1.  She pushed well with two contractions. At 8:43 AM a viable female was delivered via Vaginal, Spontaneous (Presentation: Left Occiput Anterior).  APGAR: 9, 9; weight 6 lb 14.8 oz (3140 gm)  .   Placenta status: Spontaneous, Intact.  Cord: 3 vessels with the following complications: None.  Cord pH: n/a  Anesthesia: Epidural Episiotomy: None Lacerations: Labial Suture Repair: 3.0 vicryl rapide Est. Blood Loss (mL):  20 mL  Mom to postpartum.  Baby to Couplet care / Skin to Skin.  Emily Frazier GEFFEL Emily Frazier 09/01/2024, 9:28 AM

## 2024-09-01 ENCOUNTER — Encounter (HOSPITAL_COMMUNITY): Payer: Self-pay

## 2024-09-01 ENCOUNTER — Inpatient Hospital Stay (HOSPITAL_COMMUNITY): Admitting: Anesthesiology

## 2024-09-01 ENCOUNTER — Inpatient Hospital Stay (HOSPITAL_COMMUNITY)
Admission: AD | Admit: 2024-09-01 | Discharge: 2024-09-02 | DRG: 807 | Disposition: A | Discharge status: Home / Self Care | Attending: Obstetrics | Admitting: Obstetrics

## 2024-09-01 ENCOUNTER — Other Ambulatory Visit: Payer: Self-pay

## 2024-09-01 DIAGNOSIS — Z3A39 39 weeks gestation of pregnancy: Secondary | ICD-10-CM

## 2024-09-01 DIAGNOSIS — O26893 Other specified pregnancy related conditions, third trimester: Secondary | ICD-10-CM | POA: Diagnosis present

## 2024-09-01 DIAGNOSIS — Z87891 Personal history of nicotine dependence: Secondary | ICD-10-CM | POA: Diagnosis not present

## 2024-09-01 DIAGNOSIS — O43193 Other malformation of placenta, third trimester: Principal | ICD-10-CM | POA: Diagnosis present

## 2024-09-01 LAB — CBC
HCT: 36.8 % (ref 36.0–46.0)
Hemoglobin: 12.9 g/dL (ref 12.0–15.0)
MCH: 32.8 pg (ref 26.0–34.0)
MCHC: 35.1 g/dL (ref 30.0–36.0)
MCV: 93.6 fL (ref 80.0–100.0)
Platelets: 181 K/uL (ref 150–400)
RBC: 3.93 MIL/uL (ref 3.87–5.11)
RDW: 11.6 % (ref 11.5–15.5)
WBC: 7 K/uL (ref 4.0–10.5)
nRBC: 0 % (ref 0.0–0.2)

## 2024-09-01 LAB — TYPE AND SCREEN
ABO/RH(D): O POS
Antibody Screen: NEGATIVE

## 2024-09-01 LAB — SYPHILIS: RPR W/REFLEX TO RPR TITER AND TREPONEMAL ANTIBODIES, TRADITIONAL SCREENING AND DIAGNOSIS ALGORITHM: RPR Ser Ql: NONREACTIVE

## 2024-09-01 MED ORDER — LIDOCAINE HCL (PF) 1 % IJ SOLN
INTRAMUSCULAR | Status: DC | PRN
  Administered 2024-09-01: 3 mL via SUBCUTANEOUS

## 2024-09-01 MED ORDER — ONDANSETRON HCL 4 MG PO TABS: 4.0000 mg | ORAL_TABLET | ORAL | Status: DC | PRN

## 2024-09-01 MED ORDER — COCONUT OIL OIL
1.0000 | TOPICAL_OIL | Status: DC | PRN
  Administered 2024-09-01: 1 via TOPICAL

## 2024-09-01 MED ORDER — PHENYLEPHRINE 80 MCG/ML (10ML) SYRINGE FOR IV PUSH (FOR BLOOD PRESSURE SUPPORT): 80.0000 ug | PREFILLED_SYRINGE | INTRAVENOUS | Status: DC | PRN

## 2024-09-01 MED ORDER — OXYCODONE HCL 5 MG PO TABS: 5.0000 mg | ORAL_TABLET | ORAL | Status: DC | PRN

## 2024-09-01 MED ORDER — SOD CITRATE-CITRIC ACID 500-334 MG/5ML PO SOLN: 30.0000 mL | ORAL | Status: DC | PRN

## 2024-09-01 MED ORDER — SENNOSIDES-DOCUSATE SODIUM 8.6-50 MG PO TABS
2.0000 | ORAL_TABLET | ORAL | Status: DC
  Administered 2024-09-01 – 2024-09-02 (×2): 2 via ORAL
  Filled 2024-09-01 (×2): qty 2

## 2024-09-01 MED ORDER — WITCH HAZEL-GLYCERIN EX PADS: 1.0000 | MEDICATED_PAD | CUTANEOUS | Status: DC | PRN

## 2024-09-01 MED ORDER — DIPHENHYDRAMINE HCL 25 MG PO CAPS: 25.0000 mg | ORAL_CAPSULE | Freq: Four times a day (QID) | ORAL | Status: DC | PRN

## 2024-09-01 MED ORDER — BENZOCAINE-MENTHOL 20-0.5 % EX AERO: 1.0000 | INHALATION_SPRAY | CUTANEOUS | Status: DC | PRN

## 2024-09-01 MED ORDER — ACETAMINOPHEN 325 MG PO TABS: 650.0000 mg | ORAL_TABLET | ORAL | Status: DC | PRN

## 2024-09-01 MED ORDER — OXYTOCIN-SODIUM CHLORIDE 30-0.9 UT/500ML-% IV SOLN
2.5000 [IU]/h | INTRAVENOUS | Status: DC
  Filled 2024-09-01: qty 500

## 2024-09-01 MED ORDER — ONDANSETRON HCL 4 MG/2ML IJ SOLN: 4.0000 mg | Freq: Four times a day (QID) | INTRAMUSCULAR | Status: DC | PRN

## 2024-09-01 MED ORDER — LACTATED RINGERS IV SOLN: 500.0000 mL | INTRAVENOUS | Status: DC | PRN

## 2024-09-01 MED ORDER — LIDOCAINE-EPINEPHRINE (PF) 2 %-1:200000 IJ SOLN
INTRAMUSCULAR | Status: DC | PRN
  Administered 2024-09-01: 3 mL via EPIDURAL

## 2024-09-01 MED ORDER — IBUPROFEN 600 MG PO TABS
600.0000 mg | ORAL_TABLET | Freq: Four times a day (QID) | ORAL | Status: DC
  Administered 2024-09-01 – 2024-09-02 (×4): 600 mg via ORAL
  Filled 2024-09-01 (×5): qty 1

## 2024-09-01 MED ORDER — FENTANYL-BUPIVACAINE-NACL 0.5-0.125-0.9 MG/250ML-% EP SOLN
EPIDURAL | Status: AC
  Filled 2024-09-01: qty 250

## 2024-09-01 MED ORDER — FENTANYL-BUPIVACAINE-NACL 0.5-0.125-0.9 MG/250ML-% EP SOLN
12.0000 mL/h | EPIDURAL | Status: DC | PRN
  Administered 2024-09-01: 12 mL/h via EPIDURAL

## 2024-09-01 MED ORDER — SODIUM CHLORIDE 0.9 % IV SOLN
INTRAVENOUS | Status: DC | PRN
  Administered 2024-09-01: 8 mL via EPIDURAL

## 2024-09-01 MED ORDER — ONDANSETRON HCL 4 MG/2ML IJ SOLN: 4.0000 mg | INTRAMUSCULAR | Status: DC | PRN

## 2024-09-01 MED ORDER — OXYCODONE-ACETAMINOPHEN 5-325 MG PO TABS: 2.0000 | ORAL_TABLET | ORAL | Status: DC | PRN

## 2024-09-01 MED ORDER — PRENATAL MULTIVITAMIN CH
1.0000 | ORAL_TABLET | Freq: Every day | ORAL | Status: DC
  Administered 2024-09-01 – 2024-09-02 (×2): 1 via ORAL
  Filled 2024-09-01 (×2): qty 1

## 2024-09-01 MED ORDER — TETANUS-DIPHTH-ACELL PERTUSSIS 5-2-15.5 LF-MCG/0.5 IM SUSP: 0.5000 mL | Freq: Once | INTRAMUSCULAR | Status: DC

## 2024-09-01 MED ORDER — LIDOCAINE HCL (PF) 1 % IJ SOLN: 30.0000 mL | INTRAMUSCULAR | Status: DC | PRN

## 2024-09-01 MED ORDER — EPHEDRINE 5 MG/ML INJ: 10.0000 mg | INTRAVENOUS | Status: DC | PRN

## 2024-09-01 MED ORDER — LACTATED RINGERS IV SOLN: 500.0000 mL | Freq: Once | INTRAVENOUS | Status: DC

## 2024-09-01 MED ORDER — FLEET ENEMA RE ENEM: 1.0000 | ENEMA | RECTAL | Status: DC | PRN

## 2024-09-01 MED ORDER — DIBUCAINE (PERIANAL) 1 % EX OINT: 1.0000 | TOPICAL_OINTMENT | CUTANEOUS | Status: DC | PRN

## 2024-09-01 MED ORDER — OXYCODONE HCL 5 MG PO TABS: 10.0000 mg | ORAL_TABLET | ORAL | Status: DC | PRN

## 2024-09-01 MED ORDER — OXYCODONE-ACETAMINOPHEN 5-325 MG PO TABS: 1.0000 | ORAL_TABLET | ORAL | Status: DC | PRN

## 2024-09-01 MED ORDER — DIPHENHYDRAMINE HCL 50 MG/ML IJ SOLN: 12.5000 mg | INTRAMUSCULAR | Status: DC | PRN

## 2024-09-01 MED ORDER — LACTATED RINGERS IV SOLN: INTRAVENOUS | Status: DC

## 2024-09-01 MED ORDER — SIMETHICONE 80 MG PO CHEW: 80.0000 mg | CHEWABLE_TABLET | ORAL | Status: DC | PRN

## 2024-09-01 MED ORDER — OXYTOCIN BOLUS FROM INFUSION
333.0000 mL | Freq: Once | INTRAVENOUS | Status: AC
  Administered 2024-09-01: 333 mL via INTRAVENOUS

## 2024-09-01 NOTE — Progress Notes (Signed)
 Comfortable with epidural  BP 114/74   Pulse 64   Temp 98.3 F (36.8 C) (Oral)   Resp 12   Ht 5' 7 (1.702 m)   Wt 71.8 kg   LMP 12/01/2023 (Approximate)   SpO2 98%   BMI 24.79 kg/m   Toco: q3 minutes EFM: 140s, moderate variability, category 1 SVE: 9.5/90/0, AROM clear fluid of BBOW  A/P: [id number] @ 105w2d w labor Labor: comfortable w epidural, now s/p AROM.  Anticipate SVD GBS neg

## 2024-09-01 NOTE — Lactation Note (Signed)
 This note was copied from a baby's chart. Lactation Consultation Note  Patient Name: Emily Frazier Date: 09/01/2024 Age:31 hours Reason for consult: Initial assessment;1st time breastfeeding;Term  P3. Mom didn't BF her other 2 children. This is the first time BF. Mom stated the baby is preferring the Rt. Breast and it is raw. Baby at the breast crying. LC placed pillows to the Lt. Breast in football hold. Got baby latched great. Mom denies pain. Mom stated the pain in the Rt. Breast is terrible now because the baby has it so sore. Mom's nipples are very compressible. Baby is able to obtain a deep latch. Noted when baby crying baby appears to have limited mobility w/her tongue. Baby can't raise her tongue and it curls on the side. LC can feel her frenulum close to the front of the tongue. Baby does have a high palate as well. Discussed the importance of having good body alignment and positioning to keep baby from pulling on nipples and just getting on the tip of the nipple. Coconut oil and shells given to mom for sore nipples. Mom has a pump that she has ordered that is coming soon. Newborn feeding habits, behavior, STS, I&O, body alignment, support, props reviewed. Mom encouraged to feed baby 8-12 times/24 hours and with feeding cues.  Encouraged mom to call for assistance as needed. Baby BF well when left. Maternal Data Has patient been taught Hand Expression?: Yes Does the patient have breastfeeding experience prior to this delivery?: No  Feeding    LATCH Score Latch: Grasps breast easily, tongue down, lips flanged, rhythmical sucking.  Audible Swallowing: None  Type of Nipple: Flat  Comfort (Breast/Nipple): Filling, red/small blisters or bruises, mild/mod discomfort (sore)  Hold (Positioning): Assistance needed to correctly position infant at breast and maintain latch.  LATCH Score: 5   Lactation Tools Discussed/Used Tools:  Shells  Interventions Interventions: Breast feeding basics reviewed;Assisted with latch;Skin to skin;Hand express;Breast compression;Adjust position;Support pillows;Position options;Expressed milk;Shells;Coconut oil;Education;LC Services brochure  Discharge Discharge Education: Outpatient recommendation Pump: Hands Free (mom can't remember what kind but has one coming.)  Consult Status Consult Status: Follow-up Date: 09/02/24 Follow-up type: In-patient    Corbyn Steedman G 09/01/2024, 11:09 PM

## 2024-09-01 NOTE — Anesthesia Preprocedure Evaluation (Signed)
"                                    Anesthesia Evaluation  Patient identified by MRN, date of birth, ID band Patient awake    Reviewed: Allergy & Precautions, NPO status , Patient's Chart, lab work & pertinent test results  History of Anesthesia Complications Negative for: history of anesthetic complications  Airway Mallampati: II  TM Distance: >3 FB Neck ROM: Full    Dental no notable dental hx. (+) Teeth Intact   Pulmonary neg pulmonary ROS, neg sleep apnea, neg COPD, Patient abstained from smoking.Not current smoker, former smoker   Pulmonary exam normal breath sounds clear to auscultation       Cardiovascular Exercise Tolerance: Good METS(-) hypertension(-) CAD and (-) Past MI negative cardio ROS (-) dysrhythmias  Rhythm:Regular Rate:Normal - Systolic murmurs    Neuro/Psych negative neurological ROS  negative psych ROS   GI/Hepatic ,neg GERD  ,,(+)     (-) substance abuse    Endo/Other  neg diabetes    Renal/GU negative Renal ROS     Musculoskeletal   Abdominal   Peds  Hematology Denies blood thinner use or bleeding disorders.    Anesthesia Other Findings Denies blood thinner use or bleeding diatheses. Recent labs reviewed. Past Medical History: No date: Pregnancy induced hypertension   Reproductive/Obstetrics (+) Pregnancy                              Anesthesia Physical Anesthesia Plan  ASA: 2  Anesthesia Plan: Epidural   Post-op Pain Management:    Induction:   PONV Risk Score and Plan: 3 and 2 and Treatment may vary due to age or medical condition and Ondansetron   Airway Management Planned: Natural Airway  Additional Equipment:   Intra-op Plan:   Post-operative Plan:   Informed Consent: I have reviewed the patients History and Physical, chart, labs and discussed the procedure including the risks, benefits and alternatives for the proposed anesthesia with the patient or authorized representative  who has indicated his/her understanding and acceptance.       Plan Discussed with: Surgeon  Anesthesia Plan Comments: (Discussed R/B/A of neuraxial anesthesia technique with patient: - rare risks of spinal/epidural hematoma, nerve damage, infection - Risk of PDPH - Risk of itching - Risk of nausea and vomiting - Risk of poor block necessitating replacement of epidural. - Risk of allergic reactions. Patient voiced understanding.)         Anesthesia Quick Evaluation  "

## 2024-09-01 NOTE — MAU Note (Signed)
 Pt says UC started strong at 0400. Was 4 cm in office last week. Denies HSV GBS- neg

## 2024-09-01 NOTE — Anesthesia Procedure Notes (Signed)
 Epidural Patient location during procedure: OB Start time: 09/01/2024 5:57 AM End time: 09/01/2024 6:00 AM  Staffing Anesthesiologist: Boone Fess, MD Performed: anesthesiologist   Preanesthetic Checklist Completed: patient identified, IV checked, site marked, risks and benefits discussed, surgical consent, monitors and equipment checked, pre-op evaluation and timeout performed  Epidural Patient position: sitting Prep: ChloraPrep Patient monitoring: heart rate, continuous pulse ox and blood pressure Approach: midline Location: L4-L5 Injection technique: LOR saline  Needle:  Needle type: Tuohy  Needle gauge: 17 G Needle length: 9 cm Needle insertion depth: 4 cm Catheter type: closed end flexible Catheter size: 19 Gauge Catheter at skin depth: 9 cm Test dose: negative and 1.5% lidocaine  with Epi 1:200 K  Assessment Sensory level: T10 Events: blood not aspirated, no cerebrospinal fluid, injection not painful, no injection resistance, no paresthesia and negative IV test  Additional Notes First/one attempt Pt. Evaluated and documentation done after procedure finished. Patient identified. Risks/Benefits/Options discussed with patient including but not limited to bleeding, infection, nerve damage, paralysis, failed block, incomplete pain control, headache, blood pressure changes, nausea, vomiting, reactions to medication both or allergic, itching and postpartum back pain. Confirmed with bedside nurse the patient's most recent platelet count. Confirmed with patient that they are not currently taking any anticoagulation, have any bleeding history or any family history of bleeding disorders. Patient expressed understanding and wished to proceed. All questions were answered. Sterile technique was used throughout the entire procedure. Please see nursing notes for vital signs. Test dose was given through epidural catheter and negative prior to continuing to dose epidural or start infusion.  Warning signs of high block given to the patient including shortness of breath, tingling/numbness in hands, complete motor block, or any concerning symptoms with instructions to call for help. Patient was given instructions on fall risk and not to get out of bed. All questions and concerns addressed with instructions to call with any issues or inadequate analgesia.     Patient tolerated the insertion well without immediate complications.  Reason for block: procedure for painReason for block:procedure for pain

## 2024-09-01 NOTE — Plan of Care (Signed)

## 2024-09-01 NOTE — H&P (Addendum)
 KOREEN LIZAOLA is a 29 y.o. [phone] female at [redacted]w[redacted]d presenting in labor. She reports strong contractions since 3am. Denies LOF, vaginal bleeding, or decreased fetal movement. She would like an epidural.  Her pregnancy is otherwise complicated by: -history of FGR in prior pregnancy -history of pre-eclampsia in prior pregnancy -succenturiate placenta -possible ultrasound abnormality: possible dilated loop of bowel noted on third trimester ultrasounds, 1.5cm in greatest dimension. Previously discussed with MFM, plan assessment by peds postpartum. OB History     Gravida  4   Para  2   Term  2   Preterm  0   AB  1   Living  2      SAB  1   IAB  0   Ectopic  0   Multiple  0   Live Births  2          Past Medical History:  Diagnosis Date   Pregnancy induced hypertension    Past Surgical History:  Procedure Laterality Date   WISDOM TOOTH EXTRACTION     Family History: family history includes Cancer (age of onset: 11) in her sister. Social History:  reports that she quit smoking about 8 years ago. She smoked an average of 0.1 packs per day. She has never used smokeless tobacco. She reports that she does not drink alcohol and does not use drugs.     Maternal Diabetes: No Genetic Screening: Normal Maternal Ultrasounds/Referrals: Other: possible dilated bowel Fetal Ultrasounds or other Referrals:  Other: discussed with MFM, plan postpartum evaluation Maternal Substance Abuse:  No Significant Maternal Medications:  Meds include: Other: aspirin  Significant Maternal Lab Results:  Group B Strep negative Number of Prenatal Visits:greater than 3 verified prenatal visits Maternal Vaccinations:RSV: Given during pregnancy >/=14 days ago and TDap   Review of Systems  All other systems reviewed and are negative.  Maternal Medical History:  Reason for admission: Contractions.   Contractions: Onset was 3-5 hours ago.   Frequency: regular.   Perceived severity is strong.    Fetal activity: Perceived fetal activity is normal.   Prenatal complications: No bleeding, cholelithiasis, HIV, PIH, infection, IUGR, nephrolithiasis, oligohydramnios, placental abnormality, polyhydramnios, pre-eclampsia, preterm labor, substance abuse, thrombocytopenia or thrombophilia.   Prenatal Complications - Diabetes: none.   Dilation: 8 Effacement (%): 90 Station: 0 Exam by:: Milaina Sher MD Blood pressure 110/75, pulse 75, temperature 98.3 F (36.8 C), temperature source Oral, resp. rate 12, height 5' 7 (1.702 m), weight 71.8 kg, last menstrual period 12/01/2023, SpO2 98%. Maternal Exam:  Abdomen: Patient reports no abdominal tenderness. Estimated fetal weight is 7lbs.   Fetal presentation: vertex Introitus: Normal vulva. Pelvis: adequate for delivery.   Cervix: Cervix evaluated by digital exam.     Fetal Exam Fetal Monitor Review: Baseline rate: 150.  Variability: moderate (6-25 bpm).   Pattern: accelerations present.   Fetal State Assessment: Category I - tracings are normal.   Physical Exam Vitals reviewed. Exam conducted with a chaperone present.  Constitutional:      Appearance: Normal appearance.  HENT:     Head: Normocephalic.     Nose: Nose normal.  Eyes:     Extraocular Movements: Extraocular movements intact.  Cardiovascular:     Comments: Well perfused Pulmonary:     Effort: Pulmonary effort is normal.  Abdominal:     Comments: Gravid, non-tender  Genitourinary:    General: Normal vulva.  Musculoskeletal:        General: Normal range of motion.     Cervical back:  Normal range of motion.  Skin:    General: Skin is warm and dry.  Neurological:     General: No focal deficit present.     Mental Status: She is alert and oriented to person, place, and time.  Psychiatric:        Mood and Affect: Mood normal.        Behavior: Behavior normal.        Thought Content: Thought content normal.        Judgment: Judgment normal.     Prenatal labs: ABO,  Rh: --/--/O POS (01/06 0515) Antibody: NEG (01/06 0515) Rubella:  immune 02/19/24 RPR:   non-reactive 06/10/24 HBsAg:   negative 02/19/24 HIV:   non-reactive 02/19/24 GBS:   negative 08/12/24  Assessment/Plan: LAKEDRA WASHINGTON is a 29 y.o. [id number] female at [redacted]w[redacted]d presenting in labor.  -labor: Admitted to L&D. Expectant management. Epidural placed.  -FWB: Category 1 -history of FGR in prior pregnancy: Most recent growth US  on 08/28/24 showed EFW 7lb5oz (44%), AC 38%.  -history of pre-eclampsia in prior pregnancy: Normotensive throughout this pregnancy -succenturiate placenta -possible ultrasound abnormality: possible dilated loop of bowel noted on third trimester ultrasounds, 1.5cm in greatest dimension. Previously discussed with MFM, plan assessment by peds postpartum.  Dispo: Anticipate vaginal delivery of baby girl Nyla! Partner O at the bedside.   Ralpheal Zappone A Cherron Blitzer 09/01/2024, 6:13 AM

## 2024-09-02 LAB — CBC
HCT: 30.8 % — ABNORMAL LOW (ref 36.0–46.0)
Hemoglobin: 10.7 g/dL — ABNORMAL LOW (ref 12.0–15.0)
MCH: 32.1 pg (ref 26.0–34.0)
MCHC: 34.7 g/dL (ref 30.0–36.0)
MCV: 92.5 fL (ref 80.0–100.0)
Platelets: 144 K/uL — ABNORMAL LOW (ref 150–400)
RBC: 3.33 MIL/uL — ABNORMAL LOW (ref 3.87–5.11)
RDW: 11.7 % (ref 11.5–15.5)
WBC: 8.1 K/uL (ref 4.0–10.5)
nRBC: 0 % (ref 0.0–0.2)

## 2024-09-02 LAB — BIRTH TISSUE RECOVERY COLLECTION (PLACENTA DONATION)

## 2024-09-02 MED ORDER — IBUPROFEN 600 MG PO TABS: 600.0000 mg | ORAL_TABLET | Freq: Four times a day (QID) | ORAL | 0 refills | Status: AC | PRN

## 2024-09-02 NOTE — Lactation Note (Addendum)
 This note was copied from a baby's chart. Lactation Consultation Note  Patient Name: Emily Frazier Date: 09/02/2024 Age:31 hours Reason for consult: Follow-up assessment;Term  P3, Baby cueing. Mother agreeable to latching baby. Mother brought her own nipple shields to the hospital. Reviewed how to apply, sizing and if using suggest post pumping. Mother stated that at this time, she feels baby does well latching on the bare breast.  Slight abrasion noted on mother's right nipple. Mother is using coconut oil.  Nipples are tender. Mother states they hurt initially and then discomfort subsides.  Mother latched baby on her L breast with ease and appropriate depth.   Intermittent swallows noted. Discussed post pumping and giving volume to baby if she is not latching or minimal voids/stools. Reviewed engorgement care and monitoring voids/stools.  Maternal Data Has patient been taught Hand Expression?: Yes  Feeding Mother's Current Feeding Choice: Breast Milk  LATCH Score Latch: Grasps breast easily, tongue down, lips flanged, rhythmical sucking.  Audible Swallowing: A few with stimulation  Type of Nipple: Everted at rest and after stimulation  Comfort (Breast/Nipple): Filling, red/small blisters or bruises, mild/mod discomfort  Hold (Positioning): No assistance needed to correctly position infant at breast.  LATCH Score: 8   Lactation Tools Discussed/Used Tools: Shells;Coconut oil  Interventions Interventions: Breast feeding basics reviewed;Hand express;Coconut oil;Education  Discharge Discharge Education: Engorgement and breast care;Warning signs for feeding baby Pump: Personal;Hands Free  Consult Status Consult Status: Complete Date: 09/02/24   Shannon Dines [facility] 09/02/2024, 11:54 AM

## 2024-09-02 NOTE — Progress Notes (Signed)
 Post Partum Day 1 Subjective: Pain controlled, tolerating PO. Lochia normal. Breastfeeding well  Objective: Blood pressure 119/80, pulse 69, temperature 98.3 F (36.8 C), temperature source Oral, resp. rate 18, height 5' 7 (1.702 m), weight 71.8 kg, last menstrual period 12/01/2023, SpO2 100%, unknown if currently breastfeeding.  Physical Exam:  General: alert, cooperative, and no distress Lochia: appropriate Uterine Fundus: deferred, breastfeeding DVT Evaluation: No cords or calf tenderness. No significant calf/ankle edema.  Recent Labs    09/01/24 0515 09/02/24 0415  HGB 12.9 10.7*  HCT 36.8 30.8*    Assessment/Plan: 29 yo G4 now P3013 PPD#1 s/p NSVD  - PP: doing well  - Rh positive  - Dispo: anticipate DC home today if cleared by peds   LOS: 1 day   Larraine DELENA Sharps, DO 09/02/2024, 10:41 AM

## 2024-09-02 NOTE — Anesthesia Postprocedure Evaluation (Signed)
"   Anesthesia Post Note  Patient: Emily Frazier  Procedure(s) Performed: AN AD HOC LABOR EPIDURAL     Patient location during evaluation: Mother Baby Anesthesia Type: Epidural Level of consciousness: awake and alert and oriented Pain management: satisfactory to patient Vital Signs Assessment: post-procedure vital signs reviewed and stable Respiratory status: respiratory function stable Cardiovascular status: stable Postop Assessment: no headache, no backache, epidural receding, patient able to bend at knees, no signs of nausea or vomiting, adequate PO intake and able to ambulate Anesthetic complications: no   No notable events documented.  Last Vitals:  Vitals:   09/01/24 2346 09/02/24 0511  BP: 114/73 119/80  Pulse: 69 69  Resp: 17 18  Temp: 37.1 C 36.8 C  SpO2: 98% 100%    Last Pain:  Vitals:   09/02/24 0511  TempSrc: Oral  PainSc: 0-No pain   Pain Goal:                   Dominic Mahaney      "

## 2024-09-02 NOTE — Discharge Summary (Signed)
 "    Postpartum Discharge Summary  Date of Service updated     Patient Name: Emily Frazier DOB: [birth year] MRN: [id number]  Date of admission: 09/01/2024 Delivery date:09/01/2024 Delivering provider: GRETTA GUMS Date of discharge: 09/02/2024  Admitting diagnosis: Normal labor [O80, Z37.9] Intrauterine pregnancy: [redacted]w[redacted]d     Secondary diagnosis:  Principal Problem:   Normal labor  Additional problems: History of preeclampsia in prior pregnancy, succenturiate placenta, abnormal fetal ultrasound    Discharge diagnosis: Term Pregnancy Delivered                                              Post partum procedures:None Augmentation: AROM Complications: None  Hospital course: Onset of Labor With Vaginal Delivery      29 y.o. yo [phone] at [redacted]w[redacted]d was admitted in Active Labor on 09/01/2024. Labor course was uncomplicated. Membrane Rupture Time/Date: 7:24 AM,09/01/2024  Delivery Method:Vaginal, Spontaneous Operative Delivery:N/A Episiotomy: None Lacerations:  Labial Patient had an uncomplicated postpartum course complicated by.  She is ambulating, tolerating a regular diet, passing flatus, and urinating well. Patient is discharged home in stable condition on 09/02/2024.  Newborn Data: Birth date:09/01/2024 Birth time:8:43 AM Gender:Female Living status:Living Apgars:9 ,9  Weight:3140 g  Magnesium Sulfate received: No BMZ received: No Rhophylac:N/A MMR:N/A T-DaP:Given prenatally Flu: No RSV Vaccine received: Given prenatally Transfusion:No Immunizations administered: Immunization History  Administered Date(s) Administered   Influenza,inj,Quad PF,6+ Mos 07/16/2014, 06/05/2018   Tdap 08/13/2014, 03/24/2018    Physical exam  Vitals:   09/01/24 1532 09/01/24 1940 09/01/24 2346 09/02/24 0511  BP: 112/78 101/72 114/73 119/80  Pulse: 62 68 69 69  Resp: 17 18 17 18   Temp: 97.7 F (36.5 C) 98.2 F (36.8 C) 98.8 F (37.1 C) 98.3 F (36.8 C)  TempSrc: Oral Oral Oral Oral  SpO2:  97%  98% 100%  Weight:      Height:       General: alert, cooperative, and no distress Lochia: appropriate Uterine Fundus: firm DVT Evaluation: No cords or calf tenderness. No significant calf/ankle edema. Labs: Lab Results  Component Value Date   WBC 8.1 09/02/2024   HGB 10.7 (L) 09/02/2024   HCT 30.8 (L) 09/02/2024   MCV 92.5 09/02/2024   PLT 144 (L) 09/02/2024      Latest Ref Rng & Units 11/18/2019   12:08 AM  CMP  Glucose 70 - 99 mg/dL 888   BUN 6 - 20 mg/dL 10   Creatinine 9.55 - 1.00 mg/dL 9.20   Sodium 864 - 854 mmol/L 138   Potassium 3.5 - 5.1 mmol/L 3.5   Chloride 98 - 111 mmol/L 105   CO2 22 - 32 mmol/L 25   Calcium 8.9 - 10.3 mg/dL 9.2    Edinburgh Score:    09/01/2024   11:30 AM  Edinburgh Postnatal Depression Scale Screening Tool  I have been able to laugh and see the funny side of things. 0  I have looked forward with enjoyment to things. 0  I have blamed myself unnecessarily when things went wrong. 0  I have been anxious or worried for no good reason. 0  I have felt scared or panicky for no good reason. 0  Things have been getting on top of me. 0  I have been so unhappy that I have had difficulty sleeping. 0  I have felt sad or miserable. 0  I  have been so unhappy that I have been crying. 0  The thought of harming myself has occurred to me. 0  Edinburgh Postnatal Depression Scale Total 0      After visit meds:  Allergies as of 09/02/2024   No Known Allergies      Medication List     STOP taking these medications    aspirin  EC 81 MG tablet   ferrous sulfate 325 (65 FE) MG EC tablet       TAKE these medications    ibuprofen  600 MG tablet Commonly known as: ADVIL  Take 1 tablet (600 mg total) by mouth every 6 (six) hours as needed for cramping.   prenatal multivitamin Tabs tablet Take 1 tablet by mouth daily at 12 noon.         Discharge home in stable condition Infant Feeding: Breast Infant Disposition:home with mother Discharge  instruction: per After Visit Summary and Postpartum booklet. Activity: Advance as tolerated. Pelvic rest for 6 weeks.  Diet: routine diet Anticipated Birth Control: Unsure Postpartum Appointment:6 weeks Additional Postpartum F/U: Postpartum Depression checkup Future Appointments:No future appointments. Follow up Visit:  Follow-up Information     Ob/Gyn, Landy Stains Follow up in 6 week(s).   Contact information: [address] [place] [address] [phone]                     09/02/2024 Larraine DELENA Sharps, DO   "

## 2024-09-02 NOTE — Discharge Instructions (Signed)
 Call office with any concerns [phone]

## 2024-09-09 ENCOUNTER — Telehealth (HOSPITAL_COMMUNITY): Payer: Self-pay | Admitting: *Deleted

## 2024-09-09 NOTE — Telephone Encounter (Signed)
 Attempted hospital discharge follow-up call. Left message for patient to return RN call with any questions or concerns. Allean IVAR Carton, RN, 09/09/24, [phone]
